# Patient Record
Sex: Female | Born: 1945 | Race: Black or African American | Hispanic: No | Marital: Married | Smoking: Never smoker
Health system: Southern US, Community
[De-identification: ages and names within clinical notes are randomized; demographics above are authoritative.]

## PROBLEM LIST (undated history)

## (undated) DIAGNOSIS — I10 Essential (primary) hypertension: Secondary | ICD-10-CM

## (undated) DIAGNOSIS — R7303 Prediabetes: Secondary | ICD-10-CM

## (undated) HISTORY — PX: ABDOMINAL HYSTERECTOMY: SHX81

## (undated) HISTORY — PX: NECK SURGERY: SHX720

---

## 2019-03-13 ENCOUNTER — Other Ambulatory Visit: Payer: Self-pay

## 2019-03-13 ENCOUNTER — Inpatient Hospital Stay (HOSPITAL_COMMUNITY)
Admission: EM | Admit: 2019-03-13 | Discharge: 2019-03-15 | DRG: 040 | Disposition: A | Discharge status: Home Health | Payer: Medicare Other | Attending: Internal Medicine | Admitting: Internal Medicine

## 2019-03-13 DIAGNOSIS — M5432 Sciatica, left side: Secondary | ICD-10-CM | POA: Diagnosis present

## 2019-03-13 DIAGNOSIS — I6340 Cerebral infarction due to embolism of unspecified cerebral artery: Principal | ICD-10-CM | POA: Diagnosis present

## 2019-03-13 DIAGNOSIS — E785 Hyperlipidemia, unspecified: Secondary | ICD-10-CM | POA: Diagnosis present

## 2019-03-13 DIAGNOSIS — J3801 Paralysis of vocal cords and larynx, unilateral: Secondary | ICD-10-CM | POA: Diagnosis present

## 2019-03-13 DIAGNOSIS — I253 Aneurysm of heart: Secondary | ICD-10-CM | POA: Diagnosis present

## 2019-03-13 DIAGNOSIS — Z79899 Other long term (current) drug therapy: Secondary | ICD-10-CM

## 2019-03-13 DIAGNOSIS — Z20828 Contact with and (suspected) exposure to other viral communicable diseases: Secondary | ICD-10-CM | POA: Diagnosis present

## 2019-03-13 DIAGNOSIS — E1151 Type 2 diabetes mellitus with diabetic peripheral angiopathy without gangrene: Secondary | ICD-10-CM | POA: Diagnosis present

## 2019-03-13 DIAGNOSIS — R29898 Other symptoms and signs involving the musculoskeletal system: Secondary | ICD-10-CM

## 2019-03-13 DIAGNOSIS — E1122 Type 2 diabetes mellitus with diabetic chronic kidney disease: Secondary | ICD-10-CM | POA: Diagnosis present

## 2019-03-13 DIAGNOSIS — Q2112 Patent foramen ovale: Secondary | ICD-10-CM

## 2019-03-13 DIAGNOSIS — I10 Essential (primary) hypertension: Secondary | ICD-10-CM | POA: Diagnosis present

## 2019-03-13 DIAGNOSIS — Z8249 Family history of ischemic heart disease and other diseases of the circulatory system: Secondary | ICD-10-CM

## 2019-03-13 DIAGNOSIS — I639 Cerebral infarction, unspecified: Secondary | ICD-10-CM | POA: Diagnosis not present

## 2019-03-13 DIAGNOSIS — G9389 Other specified disorders of brain: Secondary | ICD-10-CM | POA: Diagnosis present

## 2019-03-13 DIAGNOSIS — N183 Chronic kidney disease, stage 3 unspecified: Secondary | ICD-10-CM | POA: Diagnosis present

## 2019-03-13 DIAGNOSIS — G459 Transient cerebral ischemic attack, unspecified: Secondary | ICD-10-CM | POA: Diagnosis present

## 2019-03-13 DIAGNOSIS — G935 Compression of brain: Secondary | ICD-10-CM | POA: Diagnosis present

## 2019-03-13 DIAGNOSIS — Z833 Family history of diabetes mellitus: Secondary | ICD-10-CM

## 2019-03-13 DIAGNOSIS — M50221 Other cervical disc displacement at C4-C5 level: Secondary | ICD-10-CM | POA: Diagnosis present

## 2019-03-13 DIAGNOSIS — R29700 NIHSS score 0: Secondary | ICD-10-CM | POA: Diagnosis present

## 2019-03-13 DIAGNOSIS — I129 Hypertensive chronic kidney disease with stage 1 through stage 4 chronic kidney disease, or unspecified chronic kidney disease: Secondary | ICD-10-CM | POA: Diagnosis present

## 2019-03-13 DIAGNOSIS — Z7952 Long term (current) use of systemic steroids: Secondary | ICD-10-CM

## 2019-03-13 DIAGNOSIS — Q211 Atrial septal defect: Secondary | ICD-10-CM

## 2019-03-13 DIAGNOSIS — G950 Syringomyelia and syringobulbia: Secondary | ICD-10-CM | POA: Diagnosis present

## 2019-03-13 DIAGNOSIS — Z7984 Long term (current) use of oral hypoglycemic drugs: Secondary | ICD-10-CM

## 2019-03-13 DIAGNOSIS — Z6841 Body Mass Index (BMI) 40.0 and over, adult: Secondary | ICD-10-CM

## 2019-03-13 HISTORY — DX: Essential (primary) hypertension: I10

## 2019-03-13 HISTORY — DX: Prediabetes: R73.03

## 2019-03-13 LAB — COMPREHENSIVE METABOLIC PANEL
ALT: 21 U/L (ref 0–44)
AST: 22 U/L (ref 15–41)
Albumin: 3.6 g/dL (ref 3.5–5.0)
Alkaline Phosphatase: 40 U/L (ref 38–126)
Anion gap: 12 (ref 5–15)
BUN: 22 mg/dL (ref 8–23)
CO2: 26 mmol/L (ref 22–32)
Calcium: 9.2 mg/dL (ref 8.9–10.3)
Chloride: 100 mmol/L (ref 98–111)
Creatinine, Ser: 1.24 mg/dL — ABNORMAL HIGH (ref 0.44–1.00)
GFR calc Af Amer: 51 mL/min — ABNORMAL LOW (ref >60)
GFR calc non Af Amer: 44 mL/min — ABNORMAL LOW (ref >60)
Glucose, Bld: 133 mg/dL — ABNORMAL HIGH (ref 70–99)
Potassium: 4 mmol/L (ref 3.5–5.1)
Sodium: 138 mmol/L (ref 135–145)
Total Bilirubin: 0.6 mg/dL (ref 0.3–1.2)
Total Protein: 6.8 g/dL (ref 6.5–8.1)

## 2019-03-13 LAB — I-STAT CHEM 8, ED
BUN: 25 mg/dL — ABNORMAL HIGH (ref 8–23)
Calcium, Ion: 1.17 mmol/L (ref 1.15–1.40)
Chloride: 102 mmol/L (ref 98–111)
Creatinine, Ser: 1.1 mg/dL — ABNORMAL HIGH (ref 0.44–1.00)
Glucose, Bld: 127 mg/dL — ABNORMAL HIGH (ref 70–99)
HCT: 45 % (ref 36.0–46.0)
Hemoglobin: 15.3 g/dL — ABNORMAL HIGH (ref 12.0–15.0)
Potassium: 3.9 mmol/L (ref 3.5–5.1)
Sodium: 140 mmol/L (ref 135–145)
TCO2: 27 mmol/L (ref 22–32)

## 2019-03-13 LAB — DIFFERENTIAL
Abs Immature Granulocytes: 0.03 10*3/uL (ref 0.00–0.07)
Basophils Absolute: 0 10*3/uL (ref 0.0–0.1)
Basophils Relative: 0 %
Eosinophils Absolute: 0.1 10*3/uL (ref 0.0–0.5)
Eosinophils Relative: 1 %
Immature Granulocytes: 0 %
Lymphocytes Relative: 41 %
Lymphs Abs: 4.2 10*3/uL — ABNORMAL HIGH (ref 0.7–4.0)
Monocytes Absolute: 0.8 10*3/uL (ref 0.1–1.0)
Monocytes Relative: 8 %
Neutro Abs: 5 10*3/uL (ref 1.7–7.7)
Neutrophils Relative %: 50 %

## 2019-03-13 LAB — PROTIME-INR
INR: 1 (ref 0.8–1.2)
Prothrombin Time: 13 seconds (ref 11.4–15.2)

## 2019-03-13 LAB — CBC
HCT: 44.1 % (ref 36.0–46.0)
Hemoglobin: 14.4 g/dL (ref 12.0–15.0)
MCH: 29.5 pg (ref 26.0–34.0)
MCHC: 32.7 g/dL (ref 30.0–36.0)
MCV: 90.4 fL (ref 80.0–100.0)
Platelets: 264 10*3/uL (ref 150–400)
RBC: 4.88 MIL/uL (ref 3.87–5.11)
RDW: 14.7 % (ref 11.5–15.5)
WBC: 10.2 10*3/uL (ref 4.0–10.5)
nRBC: 0 % (ref 0.0–0.2)

## 2019-03-13 LAB — APTT: aPTT: 26 seconds (ref 24–36)

## 2019-03-13 NOTE — ED Triage Notes (Signed)
Pt here for numbness and weakness in her R hand this morning at 0930. Pt Denies pain. Pts sts symptoms have improved in triage. Endorses having fasted for the last ten days from meat and sweets and carbs.

## 2019-03-13 NOTE — ED Triage Notes (Signed)
Pt taken immediately upon registration to bridge to see Dr. Maryan Rued to r/o code stroke. -- cleared by Dr. Maryan Rued, returned to triage to be checked in,

## 2019-03-14 ENCOUNTER — Inpatient Hospital Stay (HOSPITAL_COMMUNITY): Payer: Medicare Other

## 2019-03-14 ENCOUNTER — Observation Stay (HOSPITAL_COMMUNITY): Payer: Medicare Other

## 2019-03-14 ENCOUNTER — Encounter (HOSPITAL_COMMUNITY): Payer: Self-pay | Admitting: Internal Medicine

## 2019-03-14 ENCOUNTER — Emergency Department (HOSPITAL_COMMUNITY): Payer: Medicare Other

## 2019-03-14 DIAGNOSIS — Z833 Family history of diabetes mellitus: Secondary | ICD-10-CM | POA: Diagnosis not present

## 2019-03-14 DIAGNOSIS — M5432 Sciatica, left side: Secondary | ICD-10-CM | POA: Diagnosis present

## 2019-03-14 DIAGNOSIS — Z7984 Long term (current) use of oral hypoglycemic drugs: Secondary | ICD-10-CM | POA: Diagnosis not present

## 2019-03-14 DIAGNOSIS — E1151 Type 2 diabetes mellitus with diabetic peripheral angiopathy without gangrene: Secondary | ICD-10-CM | POA: Diagnosis present

## 2019-03-14 DIAGNOSIS — I10 Essential (primary) hypertension: Secondary | ICD-10-CM | POA: Diagnosis not present

## 2019-03-14 DIAGNOSIS — Z6841 Body Mass Index (BMI) 40.0 and over, adult: Secondary | ICD-10-CM | POA: Diagnosis not present

## 2019-03-14 DIAGNOSIS — I6340 Cerebral infarction due to embolism of unspecified cerebral artery: Secondary | ICD-10-CM | POA: Diagnosis present

## 2019-03-14 DIAGNOSIS — G950 Syringomyelia and syringobulbia: Secondary | ICD-10-CM | POA: Diagnosis present

## 2019-03-14 DIAGNOSIS — Z79899 Other long term (current) drug therapy: Secondary | ICD-10-CM | POA: Diagnosis not present

## 2019-03-14 DIAGNOSIS — I639 Cerebral infarction, unspecified: Secondary | ICD-10-CM | POA: Diagnosis present

## 2019-03-14 DIAGNOSIS — I340 Nonrheumatic mitral (valve) insufficiency: Secondary | ICD-10-CM | POA: Diagnosis not present

## 2019-03-14 DIAGNOSIS — E785 Hyperlipidemia, unspecified: Secondary | ICD-10-CM | POA: Diagnosis present

## 2019-03-14 DIAGNOSIS — Z8249 Family history of ischemic heart disease and other diseases of the circulatory system: Secondary | ICD-10-CM | POA: Diagnosis not present

## 2019-03-14 DIAGNOSIS — Z7952 Long term (current) use of systemic steroids: Secondary | ICD-10-CM | POA: Diagnosis not present

## 2019-03-14 DIAGNOSIS — G459 Transient cerebral ischemic attack, unspecified: Secondary | ICD-10-CM

## 2019-03-14 DIAGNOSIS — I129 Hypertensive chronic kidney disease with stage 1 through stage 4 chronic kidney disease, or unspecified chronic kidney disease: Secondary | ICD-10-CM | POA: Diagnosis present

## 2019-03-14 DIAGNOSIS — J3801 Paralysis of vocal cords and larynx, unilateral: Secondary | ICD-10-CM | POA: Diagnosis present

## 2019-03-14 DIAGNOSIS — R29700 NIHSS score 0: Secondary | ICD-10-CM | POA: Diagnosis present

## 2019-03-14 DIAGNOSIS — Z20828 Contact with and (suspected) exposure to other viral communicable diseases: Secondary | ICD-10-CM | POA: Diagnosis present

## 2019-03-14 DIAGNOSIS — I361 Nonrheumatic tricuspid (valve) insufficiency: Secondary | ICD-10-CM | POA: Diagnosis not present

## 2019-03-14 DIAGNOSIS — E1122 Type 2 diabetes mellitus with diabetic chronic kidney disease: Secondary | ICD-10-CM | POA: Diagnosis present

## 2019-03-14 DIAGNOSIS — N183 Chronic kidney disease, stage 3 unspecified: Secondary | ICD-10-CM | POA: Diagnosis present

## 2019-03-14 DIAGNOSIS — G9389 Other specified disorders of brain: Secondary | ICD-10-CM | POA: Diagnosis present

## 2019-03-14 DIAGNOSIS — I253 Aneurysm of heart: Secondary | ICD-10-CM | POA: Diagnosis present

## 2019-03-14 DIAGNOSIS — M50221 Other cervical disc displacement at C4-C5 level: Secondary | ICD-10-CM | POA: Diagnosis present

## 2019-03-14 DIAGNOSIS — Q211 Atrial septal defect: Secondary | ICD-10-CM | POA: Diagnosis not present

## 2019-03-14 DIAGNOSIS — G935 Compression of brain: Secondary | ICD-10-CM | POA: Diagnosis present

## 2019-03-14 DIAGNOSIS — I6389 Other cerebral infarction: Secondary | ICD-10-CM | POA: Diagnosis not present

## 2019-03-14 LAB — COMPREHENSIVE METABOLIC PANEL
ALT: 19 U/L (ref 0–44)
AST: 17 U/L (ref 15–41)
Albumin: 3.6 g/dL (ref 3.5–5.0)
Alkaline Phosphatase: 34 U/L — ABNORMAL LOW (ref 38–126)
Anion gap: 11 (ref 5–15)
BUN: 20 mg/dL (ref 8–23)
CO2: 28 mmol/L (ref 22–32)
Calcium: 9.1 mg/dL (ref 8.9–10.3)
Chloride: 101 mmol/L (ref 98–111)
Creatinine, Ser: 1.09 mg/dL — ABNORMAL HIGH (ref 0.44–1.00)
GFR calc Af Amer: 60 mL/min — ABNORMAL LOW (ref >60)
GFR calc non Af Amer: 51 mL/min — ABNORMAL LOW (ref >60)
Glucose, Bld: 111 mg/dL — ABNORMAL HIGH (ref 70–99)
Potassium: 4 mmol/L (ref 3.5–5.1)
Sodium: 140 mmol/L (ref 135–145)
Total Bilirubin: 0.8 mg/dL (ref 0.3–1.2)
Total Protein: 6.5 g/dL (ref 6.5–8.1)

## 2019-03-14 LAB — GLUCOSE, CAPILLARY
Glucose-Capillary: 127 mg/dL — ABNORMAL HIGH (ref 70–99)
Glucose-Capillary: 117 mg/dL — ABNORMAL HIGH (ref 70–99)
Glucose-Capillary: 101 mg/dL — ABNORMAL HIGH (ref 70–99)
Glucose-Capillary: 111 mg/dL — ABNORMAL HIGH (ref 70–99)

## 2019-03-14 LAB — LIPID PANEL
Cholesterol: 173 mg/dL (ref 0–200)
HDL: 45 mg/dL (ref >40)
LDL Cholesterol: 109 mg/dL — ABNORMAL HIGH (ref 0–99)
Total CHOL/HDL Ratio: 3.8 RATIO
Triglycerides: 96 mg/dL (ref <150)
VLDL: 19 mg/dL (ref 0–40)

## 2019-03-14 LAB — HIV ANTIBODY (ROUTINE TESTING W REFLEX): HIV Screen 4th Generation wRfx: NONREACTIVE

## 2019-03-14 LAB — CBC
HCT: 43.4 % (ref 36.0–46.0)
Hemoglobin: 14.2 g/dL (ref 12.0–15.0)
MCH: 29.2 pg (ref 26.0–34.0)
MCHC: 32.7 g/dL (ref 30.0–36.0)
MCV: 89.3 fL (ref 80.0–100.0)
Platelets: 258 10*3/uL (ref 150–400)
RBC: 4.86 MIL/uL (ref 3.87–5.11)
RDW: 14.8 % (ref 11.5–15.5)
WBC: 7.4 10*3/uL (ref 4.0–10.5)
nRBC: 0 % (ref 0.0–0.2)

## 2019-03-14 LAB — ECHOCARDIOGRAM COMPLETE
Height: 65 in
Weight: 4080 oz

## 2019-03-14 LAB — CBG MONITORING, ED: Glucose-Capillary: 101 mg/dL — ABNORMAL HIGH (ref 70–99)

## 2019-03-14 LAB — HEMOGLOBIN A1C
Hgb A1c MFr Bld: 6.4 % — ABNORMAL HIGH (ref 4.8–5.6)
Mean Plasma Glucose: 136.98 mg/dL

## 2019-03-14 LAB — SARS CORONAVIRUS 2 (TAT 6-24 HRS): SARS Coronavirus 2: NEGATIVE

## 2019-03-14 MED ORDER — ACETAMINOPHEN 650 MG RE SUPP: 650.0000 mg | RECTAL | Status: DC | PRN

## 2019-03-14 MED ORDER — LISINOPRIL 20 MG PO TABS
20.0000 mg | ORAL_TABLET | Freq: Every day | ORAL | Status: DC
  Administered 2019-03-14 – 2019-03-15 (×2): 20 mg via ORAL
  Filled 2019-03-14 (×2): qty 1

## 2019-03-14 MED ORDER — STROKE: EARLY STAGES OF RECOVERY BOOK
Freq: Once | Status: DC
  Filled 2019-03-14: qty 1

## 2019-03-14 MED ORDER — PRAVASTATIN SODIUM 40 MG PO TABS
40.0000 mg | ORAL_TABLET | Freq: Every day | ORAL | Status: DC
  Administered 2019-03-14 – 2019-03-15 (×2): 40 mg via ORAL
  Filled 2019-03-14 (×2): qty 1

## 2019-03-14 MED ORDER — ASPIRIN 325 MG PO TABS
325.0000 mg | ORAL_TABLET | Freq: Every day | ORAL | Status: DC
  Administered 2019-03-14 – 2019-03-15 (×2): 325 mg via ORAL
  Filled 2019-03-14 (×2): qty 1

## 2019-03-14 MED ORDER — ASPIRIN 300 MG RE SUPP: 300.0000 mg | Freq: Every day | RECTAL | Status: DC

## 2019-03-14 MED ORDER — INSULIN ASPART 100 UNIT/ML ~~LOC~~ SOLN
0.0000 [IU] | Freq: Three times a day (TID) | SUBCUTANEOUS | Status: DC
  Administered 2019-03-14: 1 [IU] via SUBCUTANEOUS

## 2019-03-14 MED ORDER — ACETAMINOPHEN 160 MG/5ML PO SOLN: 650.0000 mg | ORAL | Status: DC | PRN

## 2019-03-14 MED ORDER — SODIUM CHLORIDE 0.9 % IV SOLN: INTRAVENOUS | Status: AC

## 2019-03-14 MED ORDER — IOHEXOL 350 MG/ML SOLN
50.0000 mL | Freq: Once | INTRAVENOUS | Status: AC | PRN
  Administered 2019-03-14: 50 mL via INTRAVENOUS

## 2019-03-14 MED ORDER — VITAMIN D 25 MCG (1000 UNIT) PO TABS
2000.0000 [IU] | ORAL_TABLET | Freq: Every day | ORAL | Status: DC
  Administered 2019-03-14 – 2019-03-15 (×2): 2000 [IU] via ORAL
  Filled 2019-03-14 (×2): qty 2

## 2019-03-14 MED ORDER — ACETAMINOPHEN 325 MG PO TABS
650.0000 mg | ORAL_TABLET | ORAL | Status: DC | PRN
  Administered 2019-03-14 – 2019-03-15 (×3): 650 mg via ORAL
  Filled 2019-03-14 (×3): qty 2

## 2019-03-14 MED ORDER — ENOXAPARIN SODIUM 40 MG/0.4ML ~~LOC~~ SOLN
40.0000 mg | SUBCUTANEOUS | Status: DC
  Administered 2019-03-15: 40 mg via SUBCUTANEOUS
  Filled 2019-03-14: qty 0.4

## 2019-03-14 NOTE — ED Notes (Signed)
Admiting at bedside will return to obtain IV and MRI

## 2019-03-14 NOTE — ED Notes (Signed)
ED TO INPATIENT HANDOFF REPORT  ED Nurse Name and Phone #:  [phone]  S Name/Age/Gender Martha Kelly 73 y.o. female Room/Bed: 025C/025C  Code Status   Code Status: Not on file  Home/SNF/Other Home Patient oriented to: self, place, time and situation Is this baseline? Yes   Triage Complete: Triage complete  Chief Complaint R Arm Weakness/Numbness  Triage Note Pt taken immediately upon registration to bridge to see Dr. Maryan Rued to r/o code stroke. -- cleared by Dr. Maryan Rued, returned to triage to be checked in,   Pt here for numbness and weakness in her R hand this morning at 0930. Pt Denies pain. Pts sts symptoms have improved in triage. Endorses having fasted for the last ten days from meat and sweets and carbs.    Allergies No Known Allergies  Level of Care/Admitting Diagnosis ED Disposition    ED Disposition Condition Comment   Admit  Hospital Area: [place] [[id number]]  Level of Care: Telemetry Medical [104]  I expect the patient will be discharged within 24 hours: No (not a candidate for 5C-Observation unit)  Covid Evaluation: Asymptomatic Screening Protocol (No Symptoms)  Diagnosis: TIA (transient ischemic attack) [376283]  Admitting Physician: Rise Patience [phone]  Attending Physician: Rise Patience [[id number]]  PT Class (Do Not Modify): Observation [104]  PT Acc Code (Do Not Modify): Observation [10022]       B Medical/Surgery History Past Medical History:  Diagnosis Date  . Hypertension   . Prediabetes       A IV Location/Drains/Wounds Patient Lines/Drains/Airways Status   Active Line/Drains/Airways    Name:   Placement date:   Placement time:   Site:   Days:   Peripheral IV 03/14/19 Right Forearm   03/14/19    0508    Forearm   less than 1          Intake/Output Last 24 hours No intake or output data in the 24 hours ending 03/14/19 0516  Labs/Imaging Results for orders placed or performed during the  hospital encounter of 03/13/19 (from the past 48 hour(s))  Protime-INR     Status: None   Collection Time: 03/13/19  7:04 PM  Result Value Ref Range   Prothrombin Time 13.0 11.4 - 15.2 seconds   INR 1.0 0.8 - 1.2    Comment: (NOTE) INR goal varies based on device and disease states. Performed at [facility], [place] [address] [place], [place] [address]   APTT     Status: None   Collection Time: 03/13/19  7:04 PM  Result Value Ref Range   aPTT 26 24 - 36 seconds    Comment: Performed at [facility] [address] [place], [place] [address]  CBC     Status: None   Collection Time: 03/13/19  7:04 PM  Result Value Ref Range   WBC 10.2 4.0 - 10.5 K/uL   RBC 4.88 3.87 - 5.11 MIL/uL   Hemoglobin 14.4 12.0 - 15.0 g/dL   HCT 44.1 36.0 - 46.0 %   MCV 90.4 80.0 - 100.0 fL   MCH 29.5 26.0 - 34.0 pg   MCHC 32.7 30.0 - 36.0 g/dL   RDW 14.7 11.5 - 15.5 %   Platelets 264 150 - 400 K/uL   nRBC 0.0 0.0 - 0.2 %    Comment: Performed at [facility], [place] [address], [place], [place] [address]  Differential     Status: Abnormal   Collection Time: 03/13/19  7:04 PM  Result Value Ref Range   Neutrophils Relative % 50 %   Neutro Abs 5.0 1.7 - 7.7 K/uL   Lymphocytes Relative 41 %   Lymphs Abs 4.2 (H) 0.7 - 4.0 K/uL   Monocytes Relative 8 %   Monocytes Absolute 0.8 0.1 - 1.0 K/uL   Eosinophils Relative 1 %   Eosinophils Absolute 0.1 0.0 - 0.5 K/uL   Basophils Relative 0 %   Basophils Absolute 0.0 0.0 - 0.1 K/uL   Immature Granulocytes 0 %   Abs Immature Granulocytes 0.03 0.00 - 0.07 K/uL    Comment: Performed at [facility], [address] [place], [place] [address]  Comprehensive metabolic panel     Status: Abnormal   Collection Time: 03/13/19  7:04 PM  Result Value Ref Range   Sodium 138 135 - 145 mmol/L   Potassium 4.0 3.5 - 5.1 mmol/L   Chloride 100 98 - 111 mmol/L   CO2 26 22 - 32 mmol/L   Glucose, Bld 133 (H) 70 - 99 mg/dL   BUN 22 8 - 23 mg/dL    Creatinine, Ser 4.40 (H) 0.44 - 1.00 mg/dL   Calcium 9.2 8.9 - 34.7 mg/dL   Total Protein 6.8 6.5 - 8.1 g/dL   Albumin 3.6 3.5 - 5.0 g/dL   AST 22 15 - 41 U/L   ALT 21 0 - 44 U/L   Alkaline Phosphatase 40 38 - 126 U/L   Total Bilirubin 0.6 0.3 - 1.2 mg/dL   GFR calc non Af Amer 44 (L) >60 mL/min   GFR calc Af Amer 51 (L) >60 mL/min   Anion gap 12 5 - 15    Comment: Performed at [facility], [address] [place], [place] [address]  I-stat chem 8, ED     Status: Abnormal   Collection Time: 03/13/19  7:16 PM  Result Value Ref Range   Sodium 140 135 - 145 mmol/L   Potassium 3.9 3.5 - 5.1 mmol/L   Chloride 102 98 - 111 mmol/L   BUN 25 (H) 8 - 23 mg/dL   Creatinine, Ser 6.38 (H) 0.44 - 1.00 mg/dL   Glucose, Bld 756 (H) 70 - 99 mg/dL   Calcium, Ion 4.33 2.95 - 1.40 mmol/L   TCO2 27 22 - 32 mmol/L   Hemoglobin 15.3 (H) 12.0 - 15.0 g/dL   HCT 18.8 41.6 - 60.6 %  CBG monitoring, ED     Status: Abnormal   Collection Time: 03/14/19  2:41 AM  Result Value Ref Range   Glucose-Capillary 101 (H) 70 - 99 mg/dL   Ct Head Wo Contrast  Result Date: 03/14/2019 CLINICAL DATA:  Transient right upper extremity weakness EXAM: CT HEAD WITHOUT CONTRAST TECHNIQUE: Contiguous axial images were obtained from the base of the skull through the vertex without intravenous contrast. COMPARISON:  None. FINDINGS: Brain: There are postsurgical changes from prior left suboccipital craniectomy with gliosis in the left cerebellar hemisphere and expected dural thickening of the posterior fossa. Patchy areas of white matter hypoattenuation are most compatible with chronic microvascular angiopathy. No evidence of acute infarction, hemorrhage, frank hydrocephalus or extra-axial collection. Vascular: Atherosclerotic calcification of the carotid siphons. No hyperdense vessel. Skull: Prior suboccipital craniectomy and partial resection of the posterior arch of C1. No acute osseous abnormality. No scalp swelling or  hematoma. Few benign scalp calcifications are present. Sinuses/Orbits: Paranasal sinuses and mastoid air cells are predominantly clear. Included orbital structures are unremarkable. Other: None IMPRESSION:  1. No CT evidence of large territory infarct or other acute intracranial abnormality. If there is persisting clinical concern for ischemia, consider MRI evaluation. 2. Postsurgical changes from prior left suboccipital craniectomy and partial resection of the posterior arch of C1. Gliosis of the left cerebellar hemisphere could reflect postsurgical changes from prior resection or infarct. Correlate with patient history. Electronically Signed   By: Kreg ShropshirePrice  DeHay M.D.   On: 03/14/2019 03:42    Pending Labs Unresulted Labs (From admission, onward)    Start     Ordered   03/14/19 0407  SARS CORONAVIRUS 2 (TAT 6-24 HRS) Nasopharyngeal Nasopharyngeal Swab  (Asymptomatic/Tier 2 Patients Labs)  Once,   STAT    Question Answer Comment  Is this test for diagnosis or screening Screening   Symptomatic for COVID-19 as defined by CDC No   Hospitalized for COVID-19 No   Admitted to ICU for COVID-19 No   Previously tested for COVID-19 No   Resident in a congregate (group) care setting No   Employed in healthcare setting No   Pregnant No      03/14/19 0406          Vitals/Pain Today's Vitals   03/14/19 0250 03/14/19 0300 03/14/19 0315 03/14/19 0445  BP:  131/87 127/63   Pulse:  82 79 86  Resp:  17 15 17   Temp:      TempSrc:      SpO2:  97% 97% 94%  Weight: 115.7 kg     Height: 5\' 5"  (1.651 m)     PainSc:        Isolation Precautions No active isolations  Medications Medications - No data to display  Mobility walks Low fall risk   Focused Assessments Neuro Assessment Handoff:  Swallow screen pass? Yes  Cardiac Rhythm: Normal sinus rhythm       Neuro Assessment: Within Defined Limits Neuro Checks:      Last Documented NIHSS Modified Score:   Has TPA been given? No If patient  is a Neuro Trauma and patient is going to OR before floor call report to 4N Charge nurse: [phone] or [phone]     R Recommendations: See Admitting Provider Note  Report given to:   Additional Notes:  RN to call back [phone]

## 2019-03-14 NOTE — H&P (Signed)
History and Physical    Martha Kelly MPN:[id number] DOB: January 25, 1946 DOA: 03/13/2019  PCP: Martha Kelly  Patient coming from: Home.  Chief Complaint: Right upper extremity numbness and weakness.  HPI: Martha Kelly is a 73 y.o. 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  Patient was not able to hold his utensils well when she was trying to eat.  Had no difficulty swallowing speaking or did not lose functions of the other extremity.  Symptoms lasted till 4 PM yesterday after he started around 9 AM.  Symptoms resolved after patient ate something.  Given the symptoms patient was brought to the ER concerning for stroke.  Patient usually lives in [place] and is visiting [place] to help with her daughter who had given birth recently.  ED Course: In the ER patient was nonfocal.  CT head shows chronic changes with nothing acute.  EKG shows normal sinus rhythm.  On-call neurologist was consulted patient admitted for TIA work-up.  On exam patient has good strength in all extremities.  No facial asymmetry pupils equal and reacting to light.  Labs show creatinine of 1.2 hemoglobin 14.4 platelets 264 COVID-19 test is pending.  Review of Systems: As per HPI, rest all negative.   Past Medical History:  Diagnosis Date  . Hypertension   . Prediabetes     Past Surgical History:  Procedure Laterality Date  . ABDOMINAL HYSTERECTOMY    . NECK SURGERY       reports that she has never smoked. She has never used smokeless tobacco. No history on file for alcohol and drug.  No Known Allergies  Family History  Problem  Relation Age of Onset  . CAD Mother   . CAD Father   . Diabetes Mellitus II Maternal Uncle     Prior to Admission medications   Medication Sig Start Date End Date Taking? Authorizing Provider  Cholecalciferol (VITAMIN D) 50 MCG (2000 UT) tablet Take 2,000 Units by mouth daily.   Yes [provider]  hydrochlorothiazide (HYDRODIURIL) 25 MG tablet Take 25 mg by mouth daily. 01/14/19  Yes [provider]  lisinopril (ZESTRIL) 20 MG tablet Take 20 mg by mouth daily. 02/23/19  Yes [provider]  LIVALO 2 MG TABS Take 2 mg by mouth at bedtime. 01/23/19  Yes [provider]  metFORMIN (GLUCOPHAGE) 500 MG tablet Take 500 mg by mouth 2 (two) times daily. 03/01/19  Yes [provider]  predniSONE (DELTASONE) 20 MG tablet Take 10 mg by mouth daily. 03/07/19  Yes [provider]    Physical Exam: Constitutional: Moderately built and nourished. Vitals:   03/14/19 0250 03/14/19 0300 03/14/19 0315 03/14/19 0445  BP:  131/87 127/63   Pulse:  82 79 86  Resp:  17 15 17   Temp:      TempSrc:      SpO2:  97% 97% 94%  Weight: 115.7 kg     Height: 5\' 5"  (1.651 m)      Eyes: Anicteric no pallor. ENMT: No discharge from the ears eyes nose or mouth. Neck: No mass felt.  No neck rigidity. Respiratory: No rhonchi or crepitations. Cardiovascular: 1 S2 heard. Abdomen: Soft nontender bowel  sounds present. Musculoskeletal: No edema. Skin: No rash. Neurologic: Alert awake oriented to time place and person.  Moves all extremities 5 x 5.  No facial asymmetry tongue is midline pupils equal and reacting to light. Psychiatric: Appears normal.   Labs on Admission: I have personally reviewed following labs and imaging studies  CBC: Recent Labs  Lab 03/13/19 1904 03/13/19 1916  WBC 10.2  --   NEUTROABS 5.0  --   HGB 14.4 15.3*  HCT 44.1 45.0  MCV 90.4  --   PLT 264  --    Basic Metabolic Panel: Recent Labs  Lab 03/13/19 1904 03/13/19 1916  NA 138  140  K 4.0 3.9  CL 100 102  CO2 26  --   GLUCOSE 133* 127*  BUN 22 25*  CREATININE 1.24* 1.10*  CALCIUM 9.2  --    GFR: Estimated Creatinine Clearance: 60.5 mL/min (A) (by C-G formula based on SCr of 1.1 mg/dL (H)). Liver Function Tests: Recent Labs  Lab 03/13/19 1904  AST 22  ALT 21  ALKPHOS 40  BILITOT 0.6  PROT 6.8  ALBUMIN 3.6   No results for input(s): LIPASE, AMYLASE in the last 168 hours. No results for input(s): AMMONIA in the last 168 hours. Coagulation Profile: Recent Labs  Lab 03/13/19 1904  INR 1.0   Cardiac Enzymes: No results for input(s): CKTOTAL, CKMB, CKMBINDEX, TROPONINI in the last 168 hours. BNP (last 3 results) No results for input(s): PROBNP in the last 8760 hours. HbA1C: No results for input(s): HGBA1C in the last 72 hours. CBG: Recent Labs  Lab 03/14/19 0241  GLUCAP 101*   Lipid Profile: No results for input(s): CHOL, HDL, LDLCALC, TRIG, CHOLHDL, LDLDIRECT in the last 72 hours. Thyroid Function Tests: No results for input(s): TSH, T4TOTAL, FREET4, T3FREE, THYROIDAB in the last 72 hours. Anemia Panel: No results for input(s): VITAMINB12, FOLATE, FERRITIN, TIBC, IRON, RETICCTPCT in the last 72 hours. Urine analysis: No results found for: COLORURINE, APPEARANCEUR, LABSPEC, PHURINE, GLUCOSEU, HGBUR, BILIRUBINUR, KETONESUR, PROTEINUR, UROBILINOGEN, NITRITE, LEUKOCYTESUR Sepsis Labs: @LABRCNTIP (procalcitonin:4,lacticidven:4) )No results found for this or any previous visit (from the past 240 hour(s)).   Radiological Exams on Admission: Ct Head Wo Contrast  Result Date: 03/14/2019 CLINICAL DATA:  Transient right upper extremity weakness EXAM: CT HEAD WITHOUT CONTRAST TECHNIQUE: Contiguous axial images were obtained from the base of the skull through the vertex without intravenous contrast. COMPARISON:  None. FINDINGS: Brain: There are postsurgical changes from prior left suboccipital craniectomy with gliosis in the left cerebellar hemisphere  and expected dural thickening of the posterior fossa. Patchy areas of white matter hypoattenuation are most compatible with chronic microvascular angiopathy. No evidence of acute infarction, hemorrhage, frank hydrocephalus or extra-axial collection. Vascular: Atherosclerotic calcification of the carotid siphons. No hyperdense vessel. Skull: Prior suboccipital craniectomy and partial resection of the posterior arch of C1. No acute osseous abnormality. No scalp swelling or hematoma. Few benign scalp calcifications are present. Sinuses/Orbits: Paranasal sinuses and mastoid air cells are predominantly clear. Included orbital structures are unremarkable. Other: None IMPRESSION: 1. No CT evidence of large territory infarct or other acute intracranial abnormality. If there is persisting clinical concern for ischemia, consider MRI evaluation. 2. Postsurgical changes from prior left suboccipital craniectomy and partial resection of the posterior arch of C1. Gliosis of the left cerebellar hemisphere could reflect postsurgical changes from prior resection or infarct. Correlate with patient history. Electronically Signed   By: Lovena Le M.D.   On: 03/14/2019 03:42    EKG: Independently  reviewed.  Normal sinus rhythm.  Assessment/Plan Principal Problem:   TIA (transient ischemic attack) Active Problems:   Essential hypertension   HLD (hyperlipidemia)    1. TIA -discussed with on-call neurologist Dr. Georgiana SpinnerSushanth Aroor.  Patient symptoms are concerning for TIA.  Patient has been placed on aspirin and is already on statins.  Will check MRI brain CT angiogram of the head and neck with contrast 2D echo physical therapy consult hemoglobin A1c lipid panel. 2. Hypertension on lisinopril.  Will hold hydrochlorothiazide since patient is getting hydration.  Allow for permissive hypertension. 3. Hyperlipidemia on statins. 4. Prediabetes we will check hemoglobin A1c. 5. Recently treated for left lower extremity sciatica.   Was on prednisone. 6. History of syringomyelia status post surgery in the 90s. 7. Chronic kidney disease stage III creatinine appears to be at baseline when compared to labs done on February 22, 2019 labs are available in care everywhere.  Note that patient is on lisinopril.   DVT prophylaxis: Lovenox. Code Status: Full code. Family Communication: Patient's daughter. Disposition Plan: Home. Consults called: Neurology. Admission status: Observation.   Eduard ClosArshad N Lajoy Vanamburg MD [facility] Pager [phone].  If 7PM-7AM, please contact night-coverage [email or web address] Password [email or web address]  03/14/2019, 6:24 AM

## 2019-03-14 NOTE — Progress Notes (Signed)
SLP Cancellation Note  Patient Details Name: Martha Kelly MRN: [id number] DOB: 06-25-1945   Cancelled treatment:       Reason Eval/Treat Not Completed: Patient at procedure or test/unavailable(Pt off unit for CT. SLP will follow up. )  Katty Fretwell I. Hardin Negus, [place], [place] Office number [phone] Pager [place] 03/14/2019, 9:05 AM

## 2019-03-14 NOTE — H&P (View-Only) (Signed)
Neurology Consultation  Reason for Consult: Transient right-sided weakness Referring Physician: Dr. Hal Hope  History is obtained from: Patient and chart  HPI: Martha Kelly is a 73 y.o. female with history of prediabetes and hypertension.  Patient states that she does not take aspirin.  yesterday morning at approximately 9:30 in the morning she noticed that her right hand felt tingly.  When she went to grab her fork to start eating breakfast she noticed that she did not have any sensation in her right hand, in addition, her hand was not working as well as it normally would.  She has been on a no meat, no carbs, and no sweets diet for about a month.  Her daughter thought possibly that this could be the reason why she is having these symptoms. For that reason, she went out and baught her a salad with some chicken.  At that point she felt the symptoms got slightly worse, thus she came to the emergency department.  By 4:00 in the afternoon she states that her symptoms had fully dissipated.  Patient states she is never had symptoms like this in the past.  She did not feel significantly weak in the right arm, and had no numbness or tingling in that right arm, no visual deficits, no swallowing difficulties, no left-sided symptoms, no diplopia.   ED course CT head   Work up that has been done: CT head and MRI of brain   LKW: 9:30 AM on 03/13/2019 tpa given?: no, symptoms resolved Premorbid modified Rankin scale (mRS): 0 NIH stroke score: 0   ROS: A 14 point ROS was performed and is negative except as noted in the HPI.   Past Medical History:  Diagnosis Date  . Hypertension   . Prediabetes     Family History  Problem Relation Age of Onset  . CAD Mother   . CAD Father   . Diabetes Mellitus II Maternal Uncle    Social History:   reports that she has never smoked. She has never used smokeless tobacco. No history on file for alcohol and drug.  Medications  Current  Facility-Administered Medications:  .   stroke: mapping our early stages of recovery book, , Does not apply, Once, Rise Patience, MD .  0.9 %  sodium chloride infusion, , Intravenous, Continuous, Rise Patience, MD .  acetaminophen (TYLENOL) tablet 650 mg, 650 mg, Oral, Q4H PRN **OR** acetaminophen (TYLENOL) solution 650 mg, 650 mg, Per Tube, Q4H PRN **OR** acetaminophen (TYLENOL) suppository 650 mg, 650 mg, Rectal, Q4H PRN, Rise Patience, MD .  aspirin suppository 300 mg, 300 mg, Rectal, Daily **OR** aspirin tablet 325 mg, 325 mg, Oral, Daily, Rise Patience, MD .  cholecalciferol (VITAMIN D3) tablet 2,000 Units, 2,000 Units, Oral, Daily, Rise Patience, MD .  enoxaparin (LOVENOX) injection 40 mg, 40 mg, Subcutaneous, Q24H, Kakrakandy, Arshad N, MD .  insulin aspart (novoLOG) injection 0-9 Units, 0-9 Units, Subcutaneous, TID WC, Rise Patience, MD .  lisinopril (ZESTRIL) tablet 20 mg, 20 mg, Oral, Daily, Rise Patience, MD .  pravastatin (PRAVACHOL) tablet 40 mg, 40 mg, Oral, q1800, Rise Patience, MD   Exam: Current vital signs: BP (!) 142/74 (BP Location: Left Arm)   Pulse 68   Temp 98.5 F (36.9 C) (Oral)   Resp 18   Ht 5\' 5"  (1.651 m)   Wt 115.7 kg   SpO2 98%   BMI 42.43 kg/m  Vital signs in last 24 hours: Temp:  [98.2  F (36.8 C)-98.9 F (37.2 C)] 98.5 F (36.9 C) (10/01 0811) Pulse Rate:  [68-92] 68 (10/01 0811) Resp:  [13-18] 18 (10/01 0811) BP: (116-142)/(63-91) 142/74 (10/01 0811) SpO2:  [94 %-100 %] 98 % (10/01 0811) Weight:  [115.7 kg] 115.7 kg (10/01 0250)  Physical Exam  Constitutional: Appears well-developed and well-nourished.  Psych: Affect appropriate to situation Eyes: No scleral injection HENT: No OP obstrucion Head: Normocephalic.  Cardiovascular: Normal rate and regular rhythm.  Respiratory: Effort normal, non-labored breathing GI: Soft.  No distension. There is no tenderness.  Skin:  WDI  Neuro: Mental Status: Patient is awake, alert, oriented to person, place, month, year, and situation. Patient is able to give a clear and coherent history. No signs of aphasia or neglect Cranial Nerves: II: Visual Fields are full.  III,IV, VI: EOMI without ptosis or diploplia. Pupils equal, round and reactive to light V: Facial sensation is symmetric to temperature VII: Facial movement is symmetric.  VIII: hearing is intact to voice X: Palat elevates symmetrically XI: Shoulder shrug is symmetric. XII: tongue is midline without atrophy or fasciculations.  Motor: Tone is normal. Bulk is normal. 5/5 strength was present in all four extremities.  Patient does orbit her left hand around her right hand.  No drift is noted bilaterally Sensory: Sensation is symmetric to light touch and temperature in the arms and legs. Deep Tendon Reflexes: 2+ and symmetric in the biceps and patellae.  Plantars: Toes are downgoing bilaterally.  Cerebellar: FNF and HKS are intact bilaterally  Labs I have reviewed labs in epic and the results pertinent to this consultation are:   CBC    Component Value Date/Time   WBC 7.4 03/14/2019 0749   RBC 4.86 03/14/2019 0749   HGB 14.2 03/14/2019 0749   HCT 43.4 03/14/2019 0749   PLT 258 03/14/2019 0749   MCV 89.3 03/14/2019 0749   MCH 29.2 03/14/2019 0749   MCHC 32.7 03/14/2019 0749   RDW 14.8 03/14/2019 0749   LYMPHSABS 4.2 (H) 03/13/2019 1904   MONOABS 0.8 03/13/2019 1904   EOSABS 0.1 03/13/2019 1904   BASOSABS 0.0 03/13/2019 1904    CMP     Component Value Date/Time   NA 140 03/14/2019 0749   K 4.0 03/14/2019 0749   CL 101 03/14/2019 0749   CO2 28 03/14/2019 0749   GLUCOSE 111 (H) 03/14/2019 0749   BUN 20 03/14/2019 0749   CREATININE 1.09 (H) 03/14/2019 0749   CALCIUM 9.1 03/14/2019 0749   PROT 6.5 03/14/2019 0749   ALBUMIN 3.6 03/14/2019 0749   AST 17 03/14/2019 0749   ALT 19 03/14/2019 0749   ALKPHOS 34 (L) 03/14/2019 0749    BILITOT 0.8 03/14/2019 0749   GFRNONAA 51 (L) 03/14/2019 0749   GFRAA 60 (L) 03/14/2019 0749    Lipid Panel     Component Value Date/Time   CHOL 173 03/14/2019 0749   TRIG 96 03/14/2019 0749   HDL 45 03/14/2019 0749   CHOLHDL 3.8 03/14/2019 0749   VLDL 19 03/14/2019 0749   LDLCALC 109 (H) 03/14/2019 0749     Imaging I have reviewed the images obtained:  CT-scan of the brain IMPRESSION: 1. No CT evidence of large territory infarct or other acute intracranial abnormality. If there is persisting clinical concern for ischemia, consider MRI evaluation. 2. Postsurgical changes from prior left suboccipital craniectomy and partial resection of the posterior arch of C1. Gliosis of the left cerebellar hemisphere could reflect postsurgical changes from prior resection or infarct. Correlate  with patient history.   MRI examination of the brain IMPRESSION: 1. 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 2. 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 There is encephalomalacia in the left more than right inferior cerebellum. 3. Mild chronic small vessel ischemia in the cerebral white matter.  CTA of head and neck-pending  -LDL 109 - Hemoglobin A1c 6.4    Felicie Morn PA-C [facility] [phone]  M-F  (9:00 am- 5:00 PM)  03/14/2019, 8:40 AM   I have seen the patient and reviewed the above note.     Assessment:  This is a 73 year old female with history of hypertension prediabetes who suffered from transient right-handed numbness and weakness.  MRI confirmed small acute infarcts in the left precentral gyrus and inferior right temporal cortex suggestive of embolic disease.  Recommend #Transthoracic Echo, may need TEE and loop monitor  # Start patient on ASA 325mg  daily,  #Start  Atorvastatin 80 mg/other high intensity statin # BP goal:  permissive HTN upto 220/120 mmHg # HBAIC and Lipid profile # Telemetry monitoring # Frequent neuro checks # NPO until passes stroke swallow screen # please page stroke NP  Or  PA  Or MD from 8am -4 pm  as this patient from this time will be  followed by the stroke.   You can look them up on [email or web address]  Password [email or web address]    , MD [facility] [phone]  If 7pm- 7am, please page neurology on call as listed in AMION.

## 2019-03-14 NOTE — Evaluation (Signed)
Physical Therapy Evaluation Patient Details Name: Martha Kelly MRN: [id number] DOB: 1946-03-01 Today's Date: 03/14/2019   History of Present Illness  Pt is a 73 y/o female admitted secondary to R hand weakness and numbness. Found to have small acute infarcts in L precentral gyrus and inferior R temporal cortex. PMH includes prediabetes and HTN.   Clinical Impression  Pt admitted secondary to problem above with deficits below. Pt complaining of LLE sciatic pain and demonstrated antalgic gait. Performed gait training with cane and required min guard A. Feel pt would benefit from outpatient PT to address sciatic pain. Will continue to follow acutely to maximize functional mobility independence and safety.     Follow Up Recommendations Outpatient PT    Equipment Recommendations  Cane    Recommendations for Other Services       Precautions / Restrictions Precautions Precautions: None Restrictions Weight Bearing Restrictions: No      Mobility  Bed Mobility               General bed mobility comments: In bathroom upon entry.   Transfers Overall transfer level: Needs assistance Equipment used: None;Straight cane Transfers: Sit to/from Stand Sit to Stand: Supervision         General transfer comment: Supervision for safety. Increased time to come to standing secondary to pain in LLE.   Ambulation/Gait Ambulation/Gait assistance: Min guard Gait Distance (Feet): 50 Feet Assistive device: None;Straight cane Gait Pattern/deviations: Step-to pattern;Step-through pattern;Decreased stride length;Decreased weight shift to left;Antalgic Gait velocity: Decreased   General Gait Details: Slow, antalgic gait without use of AD. Decreased weightshift to LLE secondary to pain. Performed gait with cane within the room and pt performing with a 3point gait pattern. Educated about moving cane while taking step with LLE to make gait more fluid, however, pt unable. Will continue to  work on gait training with cane.   Stairs            Wheelchair Mobility    Modified Rankin (Stroke Patients Only) Modified Rankin (Stroke Patients Only) Pre-Morbid Rankin Score: No significant disability Modified Rankin: Moderately severe disability     Balance Overall balance assessment: Needs assistance Sitting-balance support: No upper extremity supported;Feet supported Sitting balance-Leahy Scale: Good     Standing balance support: No upper extremity supported;Single extremity supported;During functional activity Standing balance-Leahy Scale: Fair                               Pertinent Vitals/Pain Pain Assessment: Faces Faces Pain Scale: Hurts even more Pain Location: LLE  Pain Descriptors / Indicators: Grimacing;Guarding Pain Intervention(s): Limited activity within patient's tolerance;Monitored during session;Repositioned    Home Living Family/patient expects to be discharged to:: Private residence Living Arrangements: Children Available Help at Discharge: Family;Available 24 hours/day Type of Home: House Home Access: Level entry     Home Layout: Two level;Able to live on main level with bedroom/bathroom Home Equipment: None Additional Comments: She is currently staying with her daughter. Reports in a couple weeks, she plans to go back to her home in [place], [place].     Prior Function Level of Independence: Independent               Hand Dominance   Dominant Hand: Right    Extremity/Trunk Assessment   Upper Extremity Assessment Upper Extremity Assessment: Defer to OT evaluation;RUE deficits/detail RUE Deficits / Details: Reports R hand weakness has gotten better, however, continues to report some weakness.  Lower Extremity Assessment Lower Extremity Assessment: LLE deficits/detail LLE Deficits / Details: Pt reporting LLE sciatic pain.     Cervical / Trunk Assessment Cervical / Trunk Assessment: Normal  Communication    Communication: No difficulties  Cognition Arousal/Alertness: Awake/alert Behavior During Therapy: WFL for tasks assessed/performed Overall Cognitive Status: No family/caregiver present to determine baseline cognitive functioning                                        General Comments      Exercises     Assessment/Plan    PT Assessment Patient needs continued PT services  PT Problem List Decreased strength;Decreased balance;Decreased mobility;Decreased knowledge of use of DME;Pain       PT Treatment Interventions DME instruction;Gait training;Functional mobility training;Balance training;Therapeutic exercise;Therapeutic activities;Patient/family education    PT Goals (Current goals can be found in the Care Plan section)  Acute Rehab PT Goals Patient Stated Goal: to go home PT Goal Formulation: With patient Time For Goal Achievement: 03/28/19 Potential to Achieve Goals: Good    Frequency Min 4X/week   Barriers to discharge        Co-evaluation               AM-PAC PT "6 Clicks" Mobility  Outcome Measure Help needed turning from your back to your side while in a flat bed without using bedrails?: None Help needed moving from lying on your back to sitting on the side of a flat bed without using bedrails?: None Help needed moving to and from a bed to a chair (including a wheelchair)?: A Little Help needed standing up from a chair using your arms (e.g., wheelchair or bedside chair)?: None Help needed to walk in hospital room?: A Little Help needed climbing 3-5 steps with a railing? : A Little 6 Click Score: 21    End of Session   Activity Tolerance: Patient tolerated treatment well Patient left: in chair;with call bell/phone within reach;with nursing/sitter in room Nurse Communication: Mobility status PT Visit Diagnosis: Other abnormalities of gait and mobility (R26.89);Pain Pain - Right/Left: Left Pain - part of body: Leg    Time: 1151-1210 PT  Time Calculation (min) (ACUTE ONLY): 19 min   Charges:   PT Evaluation $PT Eval Low Complexity: 1 Low          Gladys Damme, PT, DPT  Acute Rehabilitation Services  Pager: [phone] Office: [phone]   Lehman Prom 03/14/2019, 12:58 PM

## 2019-03-14 NOTE — Progress Notes (Signed)
  Echocardiogram 2D Echocardiogram has been performed.  Onesimo Lingard G Shariff Lasky 03/14/2019, 3:20 PM

## 2019-03-14 NOTE — Progress Notes (Signed)
Patient seen and examined at bedside no acute distress and resting comfortably.  She was admitted after midnight last night found to have acute stroke.  Patient reports that she has had improvement in her right upper extremity hand numbness and resolution of her symptoms.  She was seen by neurology today who recommended starting aspirin, statin, echo and consideration for loop recorder to monitor for paroxysmal atrial fibrillation.    Physical Exam Constitutional:      Appearance: Normal appearance.  HENT:     Head: Normocephalic and atraumatic.     Nose: Nose normal.     Mouth/Throat:     Mouth: Mucous membranes are moist.  Eyes:     Extraocular Movements: Extraocular movements intact.  Neck:     Musculoskeletal: Normal range of motion.  Cardiovascular:     Rate and Rhythm: Normal rate and regular rhythm.  Pulmonary:     Effort: Pulmonary effort is normal.     Breath sounds: Normal breath sounds.  Abdominal:     General: Abdomen is flat.     Palpations: Abdomen is soft.  Musculoskeletal: Normal range of motion.        General: No swelling or tenderness.  Skin:    General: Skin is warm.  Neurological:     General: No focal deficit present.     Mental Status: She is alert. Mental status is at baseline.     Cranial Nerves: No cranial nerve deficit.     Sensory: No sensory deficit.     Motor: No weakness.  Psychiatric:        Mood and Affect: Mood normal.        Behavior: Behavior normal.        Thought Content: Thought content normal.        Judgment: Judgment normal.    Plan Continue to monitor and continue with neurology recommendations.  Follow-up with PT OT.  Plan discussed with daughter via face time at bedside.  Harold Hedge, MD Pager [phone]

## 2019-03-14 NOTE — ED Notes (Signed)
Patient transported to MRI 

## 2019-03-14 NOTE — Progress Notes (Signed)
  Speech Language Pathology Treatment: Cognitive-Linquistic  Patient Details Name: Martha Kelly MRN: [id number] DOB: Mar 28, 1946 Today's Date: 03/14/2019 Time: 0951-1006 SLP Time Calculation (min) (ACUTE ONLY): 15 min  Assessment / Plan / Recommendation Clinical Impression  Martha Kelly was seen for cognitive-linguistic treatment and was cooperative throughout the session. However, she requested that it be abbreviated so that she could rest since she did not sleep last night. She achieved 60% accuracy with time management problems increasing to 100% with repetition and min cues. With problem solving related to safety she achieved 100% accuracy. She completed a 3-task sequencing mental manipulation activity with 80% accuracy increasing to 100% with min cues. She verbally sequenced activities related to cooking with min cues. SLP will continue to follow Martha Kelly.    HPI HPI: Martha Kelly is a 73 y.o. 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      SLP Plan  Continue with current plan of care  Patient needs continued Speech Lanaguage Pathology Services    Recommendations                   Follow up Recommendations: Other (comment)(Continued SLP services at level of care recommended by Martha Kelly/OT) SLP Visit Diagnosis: Cognitive communication deficit (W65.681) Plan: Continue with current plan of care       Haylyn Halberg I. Hardin Negus, [place], [place] Office number [phone] Pager [place] 03/14/2019, 10:36 AM

## 2019-03-14 NOTE — Consult Note (Addendum)
Neurology Consultation  Reason for Consult: Transient right-sided weakness Referring Physician: Dr. Hal Hope  History is obtained from: Patient and chart  HPI: Martha Kelly is a 73 y.o. female with history of prediabetes and hypertension.  Patient states that she does not take aspirin.  yesterday morning at approximately 9:30 in the morning she noticed that her right hand felt tingly.  When she went to grab her fork to start eating breakfast she noticed that she did not have any sensation in her right hand, in addition, her hand was not working as well as it normally would.  She has been on a no meat, no carbs, and no sweets diet for about a month.  Her daughter thought possibly that this could be the reason why she is having these symptoms. For that reason, she went out and baught her a salad with some chicken.  At that point she felt the symptoms got slightly worse, thus she came to the emergency department.  By 4:00 in the afternoon she states that her symptoms had fully dissipated.  Patient states she is never had symptoms like this in the past.  She did not feel significantly weak in the right arm, and had no numbness or tingling in that right arm, no visual deficits, no swallowing difficulties, no left-sided symptoms, no diplopia.   ED course CT head   Work up that has been done: CT head and MRI of brain   LKW: 9:30 AM on 03/13/2019 tpa given?: no, symptoms resolved Premorbid modified Rankin scale (mRS): 0 NIH stroke score: 0   ROS: A 14 point ROS was performed and is negative except as noted in the HPI.   Past Medical History:  Diagnosis Date  . Hypertension   . Prediabetes     Family History  Problem Relation Age of Onset  . CAD Mother   . CAD Father   . Diabetes Mellitus II Maternal Uncle    Social History:   reports that she has never smoked. She has never used smokeless tobacco. No history on file for alcohol and drug.  Medications  Current  Facility-Administered Medications:  .   stroke: mapping our early stages of recovery book, , Does not apply, Once, Rise Patience, MD .  0.9 %  sodium chloride infusion, , Intravenous, Continuous, Rise Patience, MD .  acetaminophen (TYLENOL) tablet 650 mg, 650 mg, Oral, Q4H PRN **OR** acetaminophen (TYLENOL) solution 650 mg, 650 mg, Per Tube, Q4H PRN **OR** acetaminophen (TYLENOL) suppository 650 mg, 650 mg, Rectal, Q4H PRN, Rise Patience, MD .  aspirin suppository 300 mg, 300 mg, Rectal, Daily **OR** aspirin tablet 325 mg, 325 mg, Oral, Daily, Rise Patience, MD .  cholecalciferol (VITAMIN D3) tablet 2,000 Units, 2,000 Units, Oral, Daily, Rise Patience, MD .  enoxaparin (LOVENOX) injection 40 mg, 40 mg, Subcutaneous, Q24H, Kakrakandy, Arshad N, MD .  insulin aspart (novoLOG) injection 0-9 Units, 0-9 Units, Subcutaneous, TID WC, Rise Patience, MD .  lisinopril (ZESTRIL) tablet 20 mg, 20 mg, Oral, Daily, Rise Patience, MD .  pravastatin (PRAVACHOL) tablet 40 mg, 40 mg, Oral, q1800, Rise Patience, MD   Exam: Current vital signs: BP (!) 142/74 (BP Location: Left Arm)   Pulse 68   Temp 98.5 F (36.9 C) (Oral)   Resp 18   Ht 5\' 5"  (1.651 m)   Wt 115.7 kg   SpO2 98%   BMI 42.43 kg/m  Vital signs in last 24 hours: Temp:  [98.2  F (36.8 C)-98.9 F (37.2 C)] 98.5 F (36.9 C) (10/01 0811) Pulse Rate:  [68-92] 68 (10/01 0811) Resp:  [13-18] 18 (10/01 0811) BP: (116-142)/(63-91) 142/74 (10/01 0811) SpO2:  [94 %-100 %] 98 % (10/01 0811) Weight:  [115.7 kg] 115.7 kg (10/01 0250)  Physical Exam  Constitutional: Appears well-developed and well-nourished.  Psych: Affect appropriate to situation Eyes: No scleral injection HENT: No OP obstrucion Head: Normocephalic.  Cardiovascular: Normal rate and regular rhythm.  Respiratory: Effort normal, non-labored breathing GI: Soft.  No distension. There is no tenderness.  Skin:  WDI  Neuro: Mental Status: Patient is awake, alert, oriented to person, place, month, year, and situation. Patient is able to give a clear and coherent history. No signs of aphasia or neglect Cranial Nerves: II: Visual Fields are full.  III,IV, VI: EOMI without ptosis or diploplia. Pupils equal, round and reactive to light V: Facial sensation is symmetric to temperature VII: Facial movement is symmetric.  VIII: hearing is intact to voice X: Palat elevates symmetrically XI: Shoulder shrug is symmetric. XII: tongue is midline without atrophy or fasciculations.  Motor: Tone is normal. Bulk is normal. 5/5 strength was present in all four extremities.  Patient does orbit her left hand around her right hand.  No drift is noted bilaterally Sensory: Sensation is symmetric to light touch and temperature in the arms and legs. Deep Tendon Reflexes: 2+ and symmetric in the biceps and patellae.  Plantars: Toes are downgoing bilaterally.  Cerebellar: FNF and HKS are intact bilaterally  Labs I have reviewed labs in epic and the results pertinent to this consultation are:   CBC    Component Value Date/Time   WBC 7.4 03/14/2019 0749   RBC 4.86 03/14/2019 0749   HGB 14.2 03/14/2019 0749   HCT 43.4 03/14/2019 0749   PLT 258 03/14/2019 0749   MCV 89.3 03/14/2019 0749   MCH 29.2 03/14/2019 0749   MCHC 32.7 03/14/2019 0749   RDW 14.8 03/14/2019 0749   LYMPHSABS 4.2 (H) 03/13/2019 1904   MONOABS 0.8 03/13/2019 1904   EOSABS 0.1 03/13/2019 1904   BASOSABS 0.0 03/13/2019 1904    CMP     Component Value Date/Time   NA 140 03/14/2019 0749   K 4.0 03/14/2019 0749   CL 101 03/14/2019 0749   CO2 28 03/14/2019 0749   GLUCOSE 111 (H) 03/14/2019 0749   BUN 20 03/14/2019 0749   CREATININE 1.09 (H) 03/14/2019 0749   CALCIUM 9.1 03/14/2019 0749   PROT 6.5 03/14/2019 0749   ALBUMIN 3.6 03/14/2019 0749   AST 17 03/14/2019 0749   ALT 19 03/14/2019 0749   ALKPHOS 34 (L) 03/14/2019 0749    BILITOT 0.8 03/14/2019 0749   GFRNONAA 51 (L) 03/14/2019 0749   GFRAA 60 (L) 03/14/2019 0749    Lipid Panel     Component Value Date/Time   CHOL 173 03/14/2019 0749   TRIG 96 03/14/2019 0749   HDL 45 03/14/2019 0749   CHOLHDL 3.8 03/14/2019 0749   VLDL 19 03/14/2019 0749   LDLCALC 109 (H) 03/14/2019 0749     Imaging I have reviewed the images obtained:  CT-scan of the brain IMPRESSION: 1. No CT evidence of large territory infarct or other acute intracranial abnormality. If there is persisting clinical concern for ischemia, consider MRI evaluation. 2. Postsurgical changes from prior left suboccipital craniectomy and partial resection of the posterior arch of C1. Gliosis of the left cerebellar hemisphere could reflect postsurgical changes from prior resection or infarct. Correlate   with patient history.   MRI examination of the brain IMPRESSION: 1. 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 2. 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 There is encephalomalacia in the left more than right inferior cerebellum. 3. Mild chronic small vessel ischemia in the cerebral white matter.  CTA of head and neck-pending  -LDL 109 - Hemoglobin A1c 6.4    Felicie Morn PA-C [facility] [phone]  M-F  (9:00 am- 5:00 PM)  03/14/2019, 8:40 AM   I have seen the patient and reviewed the above note.     Assessment:  This is a 73 year old female with history of hypertension prediabetes who suffered from transient right-handed numbness and weakness.  MRI confirmed small acute infarcts in the left precentral gyrus and inferior right temporal cortex suggestive of embolic disease.  Recommend #Transthoracic Echo, may need TEE and loop monitor  # Start patient on ASA 325mg  daily,  #Start  Atorvastatin 80 mg/other high intensity statin # BP goal:  permissive HTN upto 220/120 mmHg # HBAIC and Lipid profile # Telemetry monitoring # Frequent neuro checks # NPO until passes stroke swallow screen # please page stroke NP  Or  PA  Or MD from 8am -4 pm  as this patient from this time will be  followed by the stroke.   You can look them up on [email or web address]  Password [email or web address]    , MD [facility] [phone]  If 7pm- 7am, please page neurology on call as listed in AMION.

## 2019-03-14 NOTE — ED Provider Notes (Signed)
TIME SEEN: 3:03 AM  CHIEF COMPLAINT: weakness in RUE  HPI: Patient is a 73 year old female with history of hypertension, diabetes, hyperlipidemia who presents to the emergency department today with complaints of right hand weakness that started around 9:30 AM.  States she was trying to eat and she could not hold a utensil.  She felt like her arm was not as coordinated and the hand was weak.  She denies any numbness or tingling despite nursing notes.  States the symptoms lasted until approximately 4 PM and have now resolved.  No other weakness.  No headache or head injury.  No chest pain or shortness of breath.  No history of previous CVA or TIA.  Has never had similar symptoms.  States she has been fasting for the past 11 days for religious reasons.  She is still eating food and drinking water but avoiding carbohydrates, sweets.  She states her symptoms seem to improve after she ate a salad with grilled chicken at 4 PM.  She lives in [place] is here visiting.  ROS: See HPI Constitutional: no fever  Eyes: no drainage  ENT: no runny nose   Cardiovascular:  no chest pain  Resp: no SOB  GI: no vomiting GU: no dysuria Integumentary: no rash  Allergy: no hives  Musculoskeletal: no leg swelling  Neurological: no slurred speech ROS otherwise negative  PAST MEDICAL HISTORY/PAST SURGICAL HISTORY:  HTN, NIDDM, HLD  MEDICATIONS:  Prior to Admission medications   Not on File    ALLERGIES:  No Known Allergies  SOCIAL HISTORY:  No history of tobacco or alcohol use.  FAMILY HISTORY: Noncontributory.  EXAM: BP (!) 132/91 (BP Location: Right Arm)   Pulse 71   Temp 98.2 F (36.8 C) (Oral)   Resp 18   Ht 5\' 5"  (1.651 m)   Wt 115.7 kg   SpO2 98%   BMI 42.43 kg/m  CONSTITUTIONAL: Alert and oriented and responds appropriately to questions. Well-appearing; well-nourished HEAD: Normocephalic EYES: Conjunctivae clear, pupils appear equal, EOMI ENT: normal nose; moist mucous  membranes NECK: Supple, no meningismus, no nuchal rigidity, no LAD  CARD: RRR; S1 and S2 appreciated; no murmurs, no clicks, no rubs, no gallops RESP: Normal chest excursion without splinting or tachypnea; breath sounds clear and equal bilaterally; no wheezes, no rhonchi, no rales, no hypoxia or respiratory distress, speaking full sentences ABD/GI: Normal bowel sounds; non-distended; soft, non-tender, no rebound, no guarding, no peritoneal signs, no hepatosplenomegaly BACK:  The back appears normal and is non-tender to palpation, there is no CVA tenderness EXT: Normal ROM in all joints; non-tender to palpation; no edema; normal capillary refill; no cyanosis, no calf tenderness or swelling, 2+ radial pulses bilaterally    SKIN: Normal color for age and race; warm; no rash NEURO: Moves all extremities equally, strength 5/5 in all 4 extremities, cranial nerves II through XII intact, normal speech, normal grip strength bilaterally PSYCH: The patient's mood and manner are appropriate. Grooming and personal hygiene are appropriate.  MEDICAL DECISION MAKING: Patient here with weakness in the right upper extremity that started at 9:30 AM this morning and lasted till approximately 4 PM.  Now neurologically intact with NIH stroke scale of 0.  She states that she feels like it was only her hand that was weak but she felt she did not have the same coordination that she normally would have and was unable to hold a utensil and was scared to even pick up her 74-month-old granddaughter.  She does have  multiple risk factors for CVA and TIA.  Labs obtained in triage are unremarkable but head imaging was not obtained.  I have low suspicion for intracranial hemorrhage.  Will obtain head CT and then discuss with neurology for further recommendations.  She may need admission for TIA work-up.  Patient not a TPA candidate at this time given symptoms have resolved.  ED PROGRESS: 3:25 AM  Discussed with Dr. Laurence Slate with  neurology.  He agrees this patient needs stroke/TIA work-up.  Agrees with head CT and medical admission.  Would like for MRI brain without contrast also be ordered.  4:05 AM  Pt's head CT shows no acute abnormality.  Will discuss with hospitalist for admission.   4:24 AM Discussed patient's case with hospitalist, Dr. Eilene Ghazi.  I have recommended admission and patient (and family if present) agree with this plan. Admitting physician will place admission orders.   I reviewed all nursing notes, vitals, pertinent previous records, EKGs, lab and urine results, imaging (as available).   Martha Kelly was evaluated in Emergency Department on 03/14/2019 for the symptoms described in the history of present illness. She was evaluated in the context of the global COVID-19 pandemic, which necessitated consideration that the patient might be at risk for infection with the SARS-CoV-2 virus that causes COVID-19. Institutional protocols and algorithms that pertain to the evaluation of patients at risk for COVID-19 are in a state of rapid change based on information released by regulatory bodies including the CDC and federal and state organizations. These policies and algorithms were followed during the patient's care in the ED.     EKG Interpretation  Date/Time:  Wednesday March 13 2019 19:03:44 EDT Ventricular Rate:  93 PR Interval:  154 QRS Duration: 68 QT Interval:  326 QTC Calculation: 405 R Axis:   39 Text Interpretation:  Normal sinus rhythm Low voltage QRS Cannot rule out Anterior infarct , age undetermined Abnormal ECG No old tracing to compare Confirmed by Ward, Baxter Hire [phone]) on 03/14/2019 2:17:32 AM         Ward, Layla Maw, DO 03/14/19 [id number]

## 2019-03-14 NOTE — ED Notes (Signed)
Attempted to call report to call back in 10-15 min

## 2019-03-14 NOTE — ED Notes (Addendum)
Pt. Returned from MR to go up to 3W at this time

## 2019-03-14 NOTE — Evaluation (Signed)
Speech Language Pathology Evaluation Patient Details Name: Martha Kelly MRN: [id number] DOB: 30-Oct-1945 Today's Date: 03/14/2019 Time: 0930-0950 SLP Time Calculation (min) (ACUTE ONLY): 20 min  Problem List:  Patient Active Problem List   Diagnosis Date Noted  . TIA (transient ischemic attack) 03/14/2019  . Essential hypertension 03/14/2019  . HLD (hyperlipidemia) 03/14/2019  . 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 Hypertension   . Prediabetes    Past Surgical History:  Past Surgical History:  Procedure Laterality Date  . ABDOMINAL HYSTERECTOMY    . NECK SURGERY     HPI:  Pt is a 73 y.o. 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   Assessment / Plan / Recommendation Clinical Impression  Pt reported that she lives in [place], [place] but has been staying with her daughter for the past five months since her grandchildren were born. 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 Pt initially denied any acute changes in these areas but at the end of the assessment indicated that she was "slow" and is usually "pretty sharp". She stated that she had not slept last night and the impact of this on her performance is considered.   [facility] Cognitive Assessment 8.1 was completed to evaluate the pt's cognitive-linguistic skills. She achieved a score of 24/30 which is  slightly below the normal limits of 26 or more out of 30 and is suggestive of a mild impairment. She demonstrated deficits in the areas of executive function, complex problem solving, attention, mental manipulation, and delayed recall. Skilled SLP services are clinically indicated at this time to improve cognition. Pt, and nursing were educated regarding results and recommendations; both parties verbalized understanding as well as agreement with plan of care.    SLP Assessment  SLP Recommendation/Assessment: Patient needs continued Speech Lanaguage Pathology Services SLP Visit Diagnosis: Cognitive communication deficit (R41.841)    Follow Up Recommendations  Other (comment)(Continued SLP services at level of care recommended by PT/OT)    Frequency and Duration min 2x/week  2 weeks      SLP Evaluation Cognition  Overall Cognitive Status: Impaired/Different from baseline Arousal/Alertness: Awake/alert Orientation Level: Oriented X4 Attention: Focused;Sustained Focused Attention: Appears intact(Vigilance WNL: 1/1) Sustained Attention: Impaired Sustained Attention Impairment: Verbal complex(Serial 7s: 2/3) Memory: Impaired Memory Impairment: Storage deficit;Retrieval deficit;Decreased recall of new information(Immediate: 5/5; delayed: 4/5; with cue: 1/1) Awareness: Appears intact Problem Solving: Impaired Problem Solving Impairment: Verbal complex Executive Function: Sequencing;Reasoning;Organizing Reasoning: Appears intact(Abstraction: 2/2) Sequencing: Appears intact(Clock drawing: 3/3) Organizing: Appears intact(Backward digit span: 1/1; Forward digit span: 1/1)       Comprehension  Auditory Comprehension Overall Auditory Comprehension: Appears within functional limits for tasks assessed Yes/No Questions: Within Functional Limits Commands: Impaired Complex Commands: (Trail completion: 0/1) Conversation: Complex Reading Comprehension Reading Status: Not tested    Expression  Expression Primary Mode of Expression: Verbal Verbal Expression Overall Verbal Expression: Appears within functional limits for tasks assessed Initiation: No impairment Level of Generative/Spontaneous Verbalization:  Conversation Repetition: Impaired Level of Impairment: Sentence level(0/2 due to minor errors) Naming: Impairment Responsive: Not tested Confrontation: (2/3 (named camel as giraffe)) Convergent: Not tested Divergent: (1/1) Pragmatics: No impairment Written Expression Dominant Hand: Right Written Expression: (Copying cube: 1/1)   Oral / Motor  Oral Motor/Sensory Function Overall Oral Motor/Sensory Function: Within functional limits Motor Speech Overall Motor Speech: Appears within functional limits for tasks assessed Respiration: Within functional limits Phonation: Normal Resonance: Within functional limits Articulation: Within functional limitis Intelligibility: Intelligible Motor Planning: Witnin functional limits Motor Speech Errors: Not applicable   Martha Kelly I. Martha Kelly, Martha Kelly, Martha Kelly Office number [phone] Pager Martha Kelly 03/14/2019, 10:19 AM

## 2019-03-15 ENCOUNTER — Encounter (HOSPITAL_COMMUNITY)
Admission: EM | Disposition: A | Discharge status: Home Health | Payer: Self-pay | Source: Home / Self Care | Attending: Internal Medicine

## 2019-03-15 ENCOUNTER — Inpatient Hospital Stay (HOSPITAL_COMMUNITY): Payer: Medicare Other

## 2019-03-15 ENCOUNTER — Other Ambulatory Visit (HOSPITAL_COMMUNITY): Payer: Medicare Other

## 2019-03-15 ENCOUNTER — Encounter (HOSPITAL_COMMUNITY): Payer: Self-pay | Admitting: Emergency Medicine

## 2019-03-15 DIAGNOSIS — I340 Nonrheumatic mitral (valve) insufficiency: Secondary | ICD-10-CM

## 2019-03-15 DIAGNOSIS — Q211 Atrial septal defect: Secondary | ICD-10-CM

## 2019-03-15 DIAGNOSIS — I639 Cerebral infarction, unspecified: Secondary | ICD-10-CM

## 2019-03-15 DIAGNOSIS — I6389 Other cerebral infarction: Secondary | ICD-10-CM

## 2019-03-15 DIAGNOSIS — I361 Nonrheumatic tricuspid (valve) insufficiency: Secondary | ICD-10-CM

## 2019-03-15 DIAGNOSIS — Q2112 Patent foramen ovale: Secondary | ICD-10-CM

## 2019-03-15 HISTORY — PX: BUBBLE STUDY: SHX6837

## 2019-03-15 HISTORY — PX: TEE WITHOUT CARDIOVERSION: SHX5443

## 2019-03-15 HISTORY — PX: LOOP RECORDER INSERTION: EP1214

## 2019-03-15 LAB — GLUCOSE, CAPILLARY
Glucose-Capillary: 115 mg/dL — ABNORMAL HIGH (ref 70–99)
Glucose-Capillary: 108 mg/dL — ABNORMAL HIGH (ref 70–99)
Glucose-Capillary: 91 mg/dL (ref 70–99)

## 2019-03-15 SURGERY — LOOP RECORDER INSERTION

## 2019-03-15 SURGERY — ECHOCARDIOGRAM, TRANSESOPHAGEAL
Anesthesia: Moderate Sedation

## 2019-03-15 MED ORDER — ASPIRIN 81 MG PO TBEC: 81.0000 mg | DELAYED_RELEASE_TABLET | Freq: Every day | ORAL | Status: DC

## 2019-03-15 MED ORDER — ATORVASTATIN CALCIUM 40 MG PO TABS: 40.0000 mg | ORAL_TABLET | Freq: Every day | ORAL | 3 refills | Status: DC

## 2019-03-15 MED ORDER — LIDOCAINE-EPINEPHRINE 1 %-1:100000 IJ SOLN
INTRAMUSCULAR | Status: AC
  Filled 2019-03-15: qty 1

## 2019-03-15 MED ORDER — FENTANYL CITRATE (PF) 100 MCG/2ML IJ SOLN
INTRAMUSCULAR | Status: AC
  Filled 2019-03-15: qty 4

## 2019-03-15 MED ORDER — CLOPIDOGREL BISULFATE 75 MG PO TABS: 75.0000 mg | ORAL_TABLET | Freq: Every day | ORAL | 0 refills | Status: AC

## 2019-03-15 MED ORDER — STROKE: EARLY STAGES OF RECOVERY BOOK
Status: AC
  Administered 2019-03-15: 12:00:00
  Filled 2019-03-15: qty 1

## 2019-03-15 MED ORDER — LIDOCAINE-EPINEPHRINE 1 %-1:100000 IJ SOLN
INTRAMUSCULAR | Status: DC | PRN
  Administered 2019-03-15: 30 mL

## 2019-03-15 MED ORDER — BUTAMBEN-TETRACAINE-BENZOCAINE 2-2-14 % EX AERO
INHALATION_SPRAY | CUTANEOUS | Status: DC | PRN
  Administered 2019-03-15: 11:00:00 2 via TOPICAL

## 2019-03-15 MED ORDER — MIDAZOLAM HCL (PF) 5 MG/ML IJ SOLN
INTRAMUSCULAR | Status: AC
  Filled 2019-03-15: qty 2

## 2019-03-15 MED ORDER — SODIUM CHLORIDE 0.9 % IV SOLN
INTRAVENOUS | Status: DC
  Administered 2019-03-15: 10:00:00 via INTRAVENOUS

## 2019-03-15 MED ORDER — CLOPIDOGREL BISULFATE 75 MG PO TABS
75.0000 mg | ORAL_TABLET | Freq: Every day | ORAL | Status: DC
  Administered 2019-03-15: 75 mg via ORAL
  Filled 2019-03-15: qty 1

## 2019-03-15 MED ORDER — MIDAZOLAM HCL (PF) 10 MG/2ML IJ SOLN
INTRAMUSCULAR | Status: DC | PRN
  Administered 2019-03-15 (×2): 2 mg via INTRAVENOUS
  Administered 2019-03-15: 1 mg via INTRAVENOUS

## 2019-03-15 MED ORDER — FENTANYL CITRATE (PF) 100 MCG/2ML IJ SOLN
INTRAMUSCULAR | Status: DC | PRN
  Administered 2019-03-15 (×3): 25 ug via INTRAVENOUS

## 2019-03-15 MED ORDER — ASPIRIN EC 81 MG PO TBEC: 81.0000 mg | DELAYED_RELEASE_TABLET | Freq: Every day | ORAL | Status: DC

## 2019-03-15 SURGICAL SUPPLY — 2 items
MONITOR REVEAL LINQ II (Prosthesis & Implant Heart) ×1 IMPLANT
PACK LOOP INSERTION (CUSTOM PROCEDURE TRAY) ×2 IMPLANT

## 2019-03-15 NOTE — Interval H&P Note (Signed)
History and Physical Interval Note:  03/15/2019 10:32 AM  Martha Kelly  has presented today for surgery, with the diagnosis of stroke.  The various methods of treatment have been discussed with the patient and family. After consideration of risks, benefits and other options for treatment, the patient has consented to  Procedure(s): TRANSESOPHAGEAL ECHOCARDIOGRAM (TEE) (N/A) as a surgical intervention.  The patient's history has been reviewed, patient examined, no change in status, stable for surgery.  I have reviewed the patient's chart and labs.  Questions were answered to the patient's satisfaction.     Kirk Ruths

## 2019-03-15 NOTE — Progress Notes (Signed)
Physical Therapy Treatment Note  Patient seen for mobility progression. Current plan remains appropriate. CM/SW present during session and pt prefers HHPT due to difficulty with transportation.  Pt has SPC in room at time of session. Continue to progress as tolerated.     03/15/19 1642  PT Visit Information  Last PT Received On 03/15/19  Assistance Needed +1  History of Present Illness Pt is a 73 y/o female admitted secondary to R hand weakness and numbness. Found to have small acute infarcts in L precentral gyrus and inferior R temporal cortex. PMH includes prediabetes and HTN.   Subjective Data  Patient Stated Goal to go home  Precautions  Precautions None  Restrictions  Weight Bearing Restrictions No  Pain Assessment  Pain Assessment Faces  Faces Pain Scale 2  Pain Location LLE   Pain Descriptors / Indicators Grimacing;Guarding  Pain Intervention(s) Monitored during session;Repositioned  Cognition  Arousal/Alertness Awake/alert  Behavior During Therapy WFL for tasks assessed/performed  Overall Cognitive Status Within Functional Limits for tasks assessed  Bed Mobility  Overal bed mobility Modified Independent  General bed mobility comments use of rail  Transfers  Overall transfer level Needs assistance  Equipment used None  Transfers Sit to/from Stand  Sit to Stand Supervision  General transfer comment supervision for safety   Ambulation/Gait  Ambulation/Gait assistance Min assist  Gait Distance (Feet) 20 Feet  Assistive device None (pt reaching for objects to hold onto)  Gait Pattern/deviations Step-through pattern;Decreased stride length;Wide base of support  General Gait Details assist to steady; distance limited due to transport present to take pt to cath lab  Gait velocity Decreased  Balance  Overall balance assessment Needs assistance  Sitting-balance support No upper extremity supported;Feet supported  Sitting balance-Leahy Scale Good  Standing balance support  No upper extremity supported;Single extremity supported;During functional activity  Standing balance-Leahy Scale Fair  PT - End of Session  Equipment Utilized During Treatment Gait belt  Activity Tolerance Patient tolerated treatment well  Patient left with call bell/phone within reach;in bed;Other (comment) (transport present)  Nurse Communication Mobility status   PT - Assessment/Plan  PT Plan Current plan remains appropriate  PT Visit Diagnosis Other abnormalities of gait and mobility (R26.89);Pain  Pain - Right/Left Left  Pain - part of body Leg  PT Frequency (ACUTE ONLY) Min 4X/week  Follow Up Recommendations Outpatient PT  PT equipment Cane  AM-PAC PT "6 Clicks" Mobility Outcome Measure (Version 2)  Help needed turning from your back to your side while in a flat bed without using bedrails? 4  Help needed moving from lying on your back to sitting on the side of a flat bed without using bedrails? 4  Help needed moving to and from a bed to a chair (including a wheelchair)? 3  Help needed standing up from a chair using your arms (e.g., wheelchair or bedside chair)? 4  Help needed to walk in hospital room? 3  Help needed climbing 3-5 steps with a railing?  3  6 Click Score 21  Consider Recommendation of Discharge To: Home with no services  PT Goal Progression  Progress towards PT goals Progressing toward goals  PT Time Calculation  PT Start Time (ACUTE ONLY) 1550  PT Stop Time (ACUTE ONLY) 1606  PT Time Calculation (min) (ACUTE ONLY) 16 min  PT General Charges  $$ ACUTE PT VISIT 1 Visit  PT Treatments  $Gait Training 8-22 mins   Earney Navy, PTA Acute Rehabilitation Services Pager: [phone] Office: [phone]

## 2019-03-15 NOTE — Progress Notes (Signed)
PT Cancellation Note  Patient Details Name: Martha Kelly MRN: [id number] DOB: Nov 01, 1945   Cancelled Treatment:    Reason Eval/Treat Not Completed: Patient at procedure or test/unavailable patient leaving for procedure, unable to participate in PT this morning. Will attempt to try back if schedule allows and if patient is appropriate following procedure.    Deniece Ree PT, DPT, CBIS  Supplemental Physical Therapist [facility]    Pager [phone] Acute Rehab Office [phone]

## 2019-03-15 NOTE — Progress Notes (Addendum)
  Echocardiogram Transesophageal echocardiogram has been performed.  Martha Kelly 03/15/2019, 11:35 AM

## 2019-03-15 NOTE — Progress Notes (Signed)
LE venous duplex       has been completed. Preliminary results can be found under CV proc through chart review. Cassi Jenne, BS, RDMS, RVT   

## 2019-03-15 NOTE — Progress Notes (Signed)
    Transesophageal Echocardiogram Note  Martha Kelly [id number] 09-21-1945  Procedure: Transesophageal Echocardiogram Indications: CVA  Procedure Details Consent: Obtained Time Out: Verified patient identification, verified procedure, site/side was marked, verified correct patient position, special equipment/implants available, Radiology Safety Procedures followed,  medications/allergies/relevent history reviewed, required imaging and test results available.  Performed  Medications:  During this procedure the patient is administered a total of Versed 5 mg and Fentanyl 75 mcg  to achieve and maintain moderate conscious sedation.  The patient's heart rate, blood pressure, and oxygen saturation are monitored continuously during the procedure. The period of conscious sedation is 30 minutes, of which I was present face-to-face 100% of this time.  Normal LV function; mild MR and TR; atrial septal aneurysm; positive saline microcavitation study.   Complications: No apparent complications Patient did tolerate procedure well.  Kirk Ruths, MD

## 2019-03-15 NOTE — Progress Notes (Signed)
STROKE TEAM PROGRESS NOTE   INTERVAL HISTORY I have personally reviewed history of presenting illness with the patient, electronic medical records and imaging films in PACS.  She presented with sudden onset of weakness and paresthesias in the right hand which appears to have improved.  MRI scan shows a tiny punctate left frontal cortical infarct.  CT angiogram did not show significant extracranial or intracranial large vessel stenosis.  She had a TEE this morning which shows atrial septal aneurysm and PFO but no clot.  Lower extremity venous Dopplers were also obtained which showed no evidence of DVT.  LDL cholesterol is elevated at 109 mg percent and hemoglobin A1c is borderline at 6.4  Vitals:   03/15/19 1123 03/15/19 1135 03/15/19 1151 03/15/19 1318  BP: 131/62 122/68 116/81 (!) 136/120  Pulse:  69 76 94  Resp: 20 18 18 20   Temp:   98 F (36.7 C) 97.6 F (36.4 C)  TempSrc:   Oral Oral  SpO2: 98% 97% 100% 99%  Weight:      Height:        CBC:  Recent Labs  Lab 03/13/19 1904 03/13/19 1916 03/14/19 0749  WBC 10.2  --  7.4  NEUTROABS 5.0  --   --   HGB 14.4 15.3* 14.2  HCT 44.1 45.0 43.4  MCV 90.4  --  89.3  PLT 264  --  517    Basic Metabolic Panel:  Recent Labs  Lab 03/13/19 1904 03/13/19 1916 03/14/19 0749  NA 138 140 140  K 4.0 3.9 4.0  CL 100 102 101  CO2 26  --  28  GLUCOSE 133* 127* 111*  BUN 22 25* 20  CREATININE 1.24* 1.10* 1.09*  CALCIUM 9.2  --  9.1   Lipid Panel:     Component Value Date/Time   CHOL 173 03/14/2019 0749   TRIG 96 03/14/2019 0749   HDL 45 03/14/2019 0749   CHOLHDL 3.8 03/14/2019 0749   VLDL 19 03/14/2019 0749   LDLCALC 109 (H) 03/14/2019 0749   HgbA1c:  Lab Results  Component Value Date   HGBA1C 6.4 (H) 03/14/2019   Urine Drug Screen: No results found for: LABOPIA, COCAINSCRNUR, LABBENZ, AMPHETMU, THCU, LABBARB  Alcohol Level No results found for: ETH  IMAGING Ct Angio Head W Or Wo Contrast  Result Date:  03/14/2019 CLINICAL DATA:  Stroke workup EXAM: CT ANGIOGRAPHY HEAD AND NECK TECHNIQUE: Multidetector CT imaging of the head and neck was performed using the standard protocol during bolus administration of intravenous contrast. Multiplanar CT image reconstructions and MIPs were obtained to evaluate the vascular anatomy. Carotid stenosis measurements (when applicable) are obtained utilizing NASCET criteria, using the distal internal carotid diameter as the denominator. CONTRAST:  57mL OMNIPAQUE IOHEXOL 350 MG/ML SOLN COMPARISON:  Head CT and brain MRI earlier today FINDINGS: CTA NECK FINDINGS Aortic arch: Atherosclerotic calcification.  Three vessel branching. Right carotid system: Vessels are smooth and widely patent. No atheromatous changes noted. Left carotid system: Vessels are smooth and widely patent. No noted atheromatous changes. Vertebral arteries: No proximal subclavian stenosis or atherosclerosis. Tiny calcified plaque seen at the left vertebral origin. Both vertebral arteries are smooth and widely patent to the dura. Skeleton: Suboccipital craniectomy and C1 posterior ring resection presumably for Chiari malformation as noted on prior brain MRI. Disc narrowing and endplate ridging with a central disc protrusion at C4-5 with calcified annulus, presumably flattening the cord. Other neck: Medial ization of the left focal fold compared to the right. Upper chest:  Negative Review of the MIP images confirms the above findings CTA HEAD FINDINGS Anterior circulation: Atherosclerotic plaque on the carotid siphons. No branch occlusion, aneurysm, or flow limiting stenosis. Posterior circulation: Symmetric vertebral arteries. No branch occlusion, beading, or aneurysm. No flow limiting stenosis. Possible mild atheromatous irregularity of posterior cerebral arteries. Venous sinuses: Patent Anatomic variants: None significant Review of the MIP images confirms the above findings IMPRESSION: 1. No emergent finding.  Atherosclerosis is mild and there is no flow limiting stenosis of major vessels. 2. Suboccipital decompression. 3. 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 4. 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 Electronically Signed   By: Marnee SpringJonathon  Watts M.D.   On: 03/14/2019 09:50   Ct Head Wo Contrast  Result Date: 03/14/2019 CLINICAL DATA:  Transient right upper extremity weakness EXAM: CT HEAD WITHOUT CONTRAST TECHNIQUE: Contiguous axial images were obtained from the base of the skull through the vertex without intravenous contrast. COMPARISON:  None. FINDINGS: Brain: There are postsurgical changes from prior left suboccipital craniectomy with gliosis in the left cerebellar hemisphere and expected dural thickening of the posterior fossa. Patchy areas of white matter hypoattenuation are most compatible with chronic microvascular angiopathy. No evidence of acute infarction, hemorrhage, frank hydrocephalus or extra-axial collection. Vascular: Atherosclerotic calcification of the carotid siphons. No hyperdense vessel. Skull: Prior suboccipital craniectomy and partial resection of the posterior arch of C1. No acute osseous abnormality. No scalp swelling or hematoma. Few benign scalp calcifications are present. Sinuses/Orbits: Paranasal sinuses and mastoid air cells are predominantly clear. Included orbital structures are unremarkable. Other: None IMPRESSION: 1. No CT evidence of large territory infarct or other acute intracranial abnormality. If there is persisting clinical concern for ischemia, consider MRI evaluation. 2. Postsurgical changes from prior left suboccipital craniectomy and partial resection of the posterior arch of C1. Gliosis of the left cerebellar hemisphere could reflect postsurgical changes from prior resection or infarct. Correlate with patient history. Electronically Signed   By: Kreg ShropshirePrice  DeHay M.D.   On: 03/14/2019 03:42   Ct Angio Neck W Or Wo  Contrast  Result Date: 03/14/2019 CLINICAL DATA:  Stroke workup EXAM: CT ANGIOGRAPHY HEAD AND NECK TECHNIQUE: Multidetector CT imaging of the head and neck was performed using the standard protocol during bolus administration of intravenous contrast. Multiplanar CT image reconstructions and MIPs were obtained to evaluate the vascular anatomy. Carotid stenosis measurements (when applicable) are obtained utilizing NASCET criteria, using the distal internal carotid diameter as the denominator. CONTRAST:  50mL OMNIPAQUE IOHEXOL 350 MG/ML SOLN COMPARISON:  Head CT and brain MRI earlier today FINDINGS: CTA NECK FINDINGS Aortic arch: Atherosclerotic calcification.  Three vessel branching. Right carotid system: Vessels are smooth and widely patent. No atheromatous changes noted. Left carotid system: Vessels are smooth and widely patent. No noted atheromatous changes. Vertebral arteries: No proximal subclavian stenosis or atherosclerosis. Tiny calcified plaque seen at the left vertebral origin. Both vertebral arteries are smooth and widely patent to the dura. Skeleton: Suboccipital craniectomy and C1 posterior ring resection presumably for Chiari malformation as noted on prior brain MRI. Disc narrowing and endplate ridging with a central disc protrusion at C4-5 with calcified annulus, presumably flattening the cord. Other neck: Medial ization of the left focal fold compared to the right. Upper chest: Negative Review of the MIP images confirms the above findings CTA HEAD FINDINGS Anterior circulation: Atherosclerotic plaque on the carotid siphons. No branch occlusion, aneurysm, or flow limiting stenosis. Posterior circulation: Symmetric vertebral arteries. No branch occlusion, beading, or  aneurysm. No flow limiting stenosis. Possible mild atheromatous irregularity of posterior cerebral arteries. Venous sinuses: Patent Anatomic variants: None significant Review of the MIP images confirms the above findings IMPRESSION: 1. No  emergent finding. Atherosclerosis is mild and there is no flow limiting stenosis of major vessels. 2. Suboccipital decompression. 3. 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 4. 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 Electronically Signed   By: Marnee Spring M.D.   On: 03/14/2019 09:50   Mr Brain Wo Contrast  Result Date: 03/14/2019 CLINICAL DATA:  Right upper extremity weakness that is now resolved, evaluate for TIA. EXAM: MRI HEAD WITHOUT CONTRAST TECHNIQUE: Multiplanar, multiecho pulse sequences of the brain and surrounding structures were obtained without intravenous contrast. COMPARISON:  Head CT from earlier today FINDINGS: Brain: Subcentimeter acute infarcts along the inferior right temporal cortex and along the precentral gyrus superiorly. No acute hemorrhage, hydrocephalus, or masslike finding. Ischemic and/or postoperative encephalomalacia in the bilateral inferior cerebellum after suboccipital decompression. 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 No continued foramen magnum stenosis. Mild small vessel ischemic gliosis in the cerebral white matter. Vascular: Normal flow voids Skull and upper cervical spine: Suboccipital craniectomy Sinuses/Orbits: Negative IMPRESSION: 1. 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 2. 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 There is encephalomalacia in the left more than right inferior cerebellum. 3. Mild chronic small vessel ischemia in the cerebral white matter. Electronically Signed   By: Marnee Spring M.D.   On: 03/14/2019 06:56    PHYSICAL EXAM Obese elderly African-American lady not in distress. . Afebrile. Head is nontraumatic. Neck is supple without bruit.    Cardiac exam no murmur or gallop. Lungs are clear to auscultation. Distal pulses are  well felt. Neurological Exam ;  Awake  Alert oriented x 3. Normal speech and language.eye movements full without nystagmus.fundi were not visualized. Vision acuity and fields appear normal. Hearing is normal. Palatal movements are normal. Face symmetric. Tongue midline. Normal strength, tone, reflexes and coordination. diminished fine finger movements on the right.  Orbits left over right upper extremity.  Slight weakness of right grip only.  Normal sensation. Gait deferred.  ASSESSMENT/PLAN Ms. Martha Kelly is a 73 y.o. female with history of prediabetes and HTN presenting with transient R hand numbness and weakness.   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 Postsurgical changes from prior L suboccipital crani and partial resection posterior arch C1. L cerebellar gliosis.   MRI  Small L precentral gyrus and inferior R temporal cortex infarcts. Suboccipital crani w/ L>R inferior cerebellar encephalomalacia. Mild small vessel disease.   CTA head & neck no ELVO. Mild atherosclerosis. Sub occipital crani. Cervical spine degeneration w/ chronic C4-5 disc protrusion. Suspect L vocal cord paresis.   2D Echo EF 60-65%. No source of embolus   TEE small PFO, ASA, positive bubble  LE dopplers negative for DVT   consider loop placement to look for AF as source of stroke  LDL 109  HgbA1c 6.4  Lovenox 40 mg sq daily for VTE prophylaxis  No antithrombotic prior to admission, now on aspirin 325 mg daily. Given mild stroke, recommend aspirin 81 mg and plavix 75 mg daily x 3 weeks, then aspirin alone. Orders adjusted.   Therapy recommendations:  OP  PT, OP SLP  Disposition:  pending   Hypertension  Stable . Permissive hypertension (OK if < 220/120) but gradually normalize in 5-7 days . Long-term BP goal normotensive  Hyperlipidemia  Home meds:  Livalo, resumed in hospital  as pravachol 40 (formulary adjustment)  LDL 109, goal < 70  Continue statin at discharge  Diabetes type II Controlled  HgbA1c 6.4, goal < 7.0  Other Stroke Risk Factors  Advanced age  Morbid Obesity, Body mass index is 42.19 kg/m., recommend weight loss, diet and exercise as appropriate   Other Active Problems    Hospital day # 1  I have personally obtained history,examined this patient, reviewed notes, independently viewed imaging studies, participated in medical decision making and plan of care.ROS completed by me personally and pertinent positives fully documented  I have made any additions or clarifications directly to the above note.  She presented with sudden onset of right hand weakness and paresthesia secondary to embolic left frontal tiny MCA branch infarct.  Imaging and cardiac evaluation is unremarkable except for a PFO but patient has a low rope score given her age and vascular risk factors and is recommended conservative medical follow-up and aspirin and Plavix for 3 weeks followed by aspirin alone and aggressive risk factor modification.  May consider possible participation in the [place] trial for cryptogenic stroke in the future if interested patient will be discharged later today after loop recorder insertion.  Long discussion with patient as well as with her daughter via face time and answered questions.  Greater than 50% time during this 25-minute visit was spent on counseling and coordination of care about her cryptogenic stroke and answering questions.  Follow-up as an outpatient stroke clinic in 6 weeks.  Delia Heady, MD  Delia Heady, MD Medical Director [facility] Pager: [phone] 03/15/2019 3:47 PM   To contact Stroke Continuity provider, please refer to [email or web address]. After hours, contact General Neurology

## 2019-03-15 NOTE — Progress Notes (Signed)
    [facility] has been requested to perform a transesophageal echocardiogram on Martha Kelly for stroke.  After careful review of history and examination, the risks and benefits of transesophageal echocardiogram have been explained including risks of esophageal damage, perforation (1:10,000 risk), bleeding, pharyngeal hematoma as well as other potential complications associated with conscious sedation including aspiration, arrhythmia, respiratory failure and death. Alternatives to treatment were discussed, questions were answered. Patient is willing to proceed.  TEE - Dr. Pennie Banter today  @ 1230 . Keep NPO.  Meds with sips.   Martha Kail, PA-C 03/15/2019 8:04 AM

## 2019-03-15 NOTE — Discharge Instructions (Signed)
Heart monitor implant site care instructions °Keep incision clean and dry for 3 days. °You can remove outer dressing tomorrow. °Leave steri-strips (little pieces of tape) on until seen in the office for wound check appointment. °Call the office ([phone]) for redness, drainage, swelling, or fever. ° °

## 2019-03-15 NOTE — Evaluation (Signed)
Occupational Therapy Evaluation Patient Details Name: Martha Kelly MRN: [id number] DOB: 1945/07/27 Today's Date: 03/15/2019    History of Present Illness Pt is a 73 y/o female admitted secondary to R hand weakness and numbness. Found to have small acute infarcts in L precentral gyrus and inferior R temporal cortex. PMH includes prediabetes and HTN.    Clinical Impression   Pt PTA: living with daughter as pt is visiting from [place]. Pt reports that she feels as though she is back to baseline, but requiring SPC for safe mobility.  Pt standing at sink for ADL with no LOB episodes. Pt performing ADL tasks with no difficulty with R FMC to open containers and feed self. Pt requires no OT follow-up at this time. OT signing off.    Follow Up Recommendations  No OT follow up    Equipment Recommendations  None recommended by OT    Recommendations for Other Services       Precautions / Restrictions Precautions Precautions: None Restrictions Weight Bearing Restrictions: No      Mobility Bed Mobility               General bed mobility comments: At sink upon entry.  Transfers Overall transfer level: Needs assistance Equipment used: None;Straight cane Transfers: Sit to/from Stand Sit to Stand: Supervision         General transfer comment: Pt performing transfers with supervisionA, but no physical assist needed.    Balance Overall balance assessment: Needs assistance Sitting-balance support: No upper extremity supported;Feet supported Sitting balance-Leahy Scale: Good     Standing balance support: No upper extremity supported;Single extremity supported;During functional activity Standing balance-Leahy Scale: Fair                             ADL either performed or assessed with clinical judgement   ADL Overall ADL's : At baseline                                       General ADL Comments: Pt standing at sink for ADL with no  mobility LOB episodes. Pt performing ADL tasks with no difficulty with FMC to open containers and feed self.     Vision Baseline Vision/History: No visual deficits Vision Assessment?: No apparent visual deficits     Perception     Praxis      Pertinent Vitals/Pain Pain Assessment: No/denies pain Pain Score: 0-No pain Pain Intervention(s): Monitored during session     Hand Dominance Right   Extremity/Trunk Assessment Upper Extremity Assessment Upper Extremity Assessment: Generalized weakness;RUE deficits/detail RUE Deficits / Details: R hand FMC increasing function per pt.   Lower Extremity Assessment Lower Extremity Assessment: Generalized weakness;Defer to PT evaluation   Cervical / Trunk Assessment Cervical / Trunk Assessment: Normal   Communication Communication Communication: No difficulties   Cognition Arousal/Alertness: Awake/alert Behavior During Therapy: WFL for tasks assessed/performed Overall Cognitive Status: No family/caregiver present to determine baseline cognitive functioning                                     General Comments  Pt reports that she feels as though she is back to baseline, but requiring SPC for safe mobility.    Exercises     Shoulder Instructions      Home  Living Family/patient expects to be discharged to:: Private residence Living Arrangements: Children Available Help at Discharge: Family;Available 24 hours/day Type of Home: House Home Access: Level entry     Home Layout: Two level;Able to live on main level with bedroom/bathroom     Bathroom Shower/Tub: Occupational psychologist: Handicapped height     Home Equipment: None   Additional Comments: She is currently staying with her daughter. Reports in a couple weeks, she plans to go back to her home in [place], [place].   Lives With: Daughter    Prior Functioning/Environment Level of Independence: Independent                 OT Problem  List:        OT Treatment/Interventions:      OT Goals(Current goals can be found in the care plan section) Acute Rehab OT Goals Patient Stated Goal: to go home OT Goal Formulation: With patient  OT Frequency:     Barriers to D/C:            Co-evaluation              AM-PAC OT "6 Clicks" Daily Activity     Outcome Measure Help from another person eating meals?: None Help from another person taking care of personal grooming?: None Help from another person toileting, which includes using toliet, bedpan, or urinal?: None Help from another person bathing (including washing, rinsing, drying)?: A Little Help from another person to put on and taking off regular upper body clothing?: None Help from another person to put on and taking off regular lower body clothing?: None 6 Click Score: 23   End of Session Equipment Utilized During Treatment: Gait belt;Rolling walker Nurse Communication: Mobility status  Activity Tolerance: Patient tolerated treatment well Patient left: in bed;with call bell/phone within reach  OT Visit Diagnosis: Muscle weakness (generalized) (M62.81)                Time: 3254-9826 OT Time Calculation (min): 17 min Charges:  OT General Charges $OT Visit: 1 Visit OT Evaluation $OT Eval Moderate Complexity: 1 Mod  Darryl Nestle) Marsa Aris OTR/L Acute Rehabilitation Services Pager: [phone] Office: [place] 03/15/2019, 4:20 PM

## 2019-03-15 NOTE — Discharge Summary (Addendum)
Physician Discharge Summary  Martha Kelly ZOX:[id number] DOB: 09-Nov-1945 DOA: 03/13/2019  PCP: Frankey Poot, MD  Admit date: 03/13/2019 Discharge date: 03/15/2019  Admitted From: Home Disposition: Home   Recommendations for Outpatient Follow-up:  1. Follow up with PCP in 2-3 weeks 2. Patient being discharged on aspirin 81 mg and Plavix 75 mg daily x3 weeks then aspirin alone. 3. Follow-up with neurology in 6 weeks 4. HA1C 6.4, prediabetic.  Encourage stroke risk factor modification 5. 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  Home Health: no Equipment/Devices: Cane  Discharge Condition: Stable CODE STATUS: Full Diet recommendation: Carb control  Brief/Interim Summary: This is a pleasant 73 year old female with past medical history of prediabetes, hypertension who presented to the ED on 03/14/2019 after having numbness in her right hand the day prior while eating breakfast.  Patient stated she went to grab a fork while eating breakfast but also had weak grip at the same time.  She reported she had been fasting for the past 1 to 2 weeks prior and thought her symptoms were related to this but did not have improvement and subsequently went to the ED.  Her symptoms had completely resolved while hospitalized.  She underwent CT head without evidence of large territory infarct.  She also underwent MRI brain without contrast and was found to have small acute infarcts in the left precentral gyrus and inferior right temporal cortex suggesting embolic disease along with chronic small vessel ischemia.  Neurology was consulted.  She was started on full dose aspirin, statin and allowed permissive hypertension with telemetry, frequent neuro checks and echo was ordered.  Patient's echo could not exclude ASD/PFO and she subsequently underwent TEE.  TEE showed small PFO.  Patient underwent lower extremity venous ultrasound which did not show evidence of  VTE's bilaterally.  Patient had successful insertion of loop recorder prior to discharge to evaluate for atrial fibrillation as cause of her cryptogenic stroke.  Patient discharged with aspirin 81 mg and Plavix 75 mg x 3 weeks.  Plan for aspirin monotherapy post 3 weeks.  Livalo switched to atorvastatin 40 mg daily which patient agreed to.  Patient needs to make an appointment with neurology for follow-up in 6 weeks  Patient to go home with home health  Discharge Diagnoses:  Principal Problem:   Cryptogenic stroke [facility]) Active Problems:   Essential hypertension   HLD (hyperlipidemia)   PFO (patent foramen ovale)    Discharge Instructions  Discharge Instructions    Ambulatory referral to Neurology   Complete by: As directed    An appointment is requested in approximately: 6 weeks with Dr. Pearlean Brownie   Diet - low sodium heart healthy   Complete by: As directed    Discharge instructions   Complete by: As directed    You came to the hospital with numbness in your right hand and you were found to have a stroke.  You were evaluated by cardiology and you had a loop recorder implanted on your skin to monitor for any irregular heart rhythm which may have caused your stroke.  Medication adjustments: Start taking aspirin 81 mg and plavix 75 mg daily x 3 weeks, then aspirin alone. Stop taking Livalo and start taking Lipitor 40 mg daily Follow-up with your primary care physician in the next 2 to 3 weeks Follow-up with neurology in 6 weeks with Dr. Pearlean Brownie Stop taking prednisone  You were found to have prediabetes.  This is a risk factor for stroke.  Make sure you stick to a low carbohydrate diet while at home and discuss this with your primary care physician.   Increase activity slowly   Complete by: As directed      Allergies as of 03/15/2019   No Known Allergies     Medication List    STOP taking these medications   Livalo 2 MG Tabs Generic drug: Pitavastatin Calcium   predniSONE 20 MG  tablet Commonly known as: DELTASONE     TAKE these medications   aspirin 81 MG EC tablet Take 1 tablet (81 mg total) by mouth daily. Start taking on: March 16, 2019   atorvastatin 40 MG tablet Commonly known as: Lipitor Take 1 tablet (40 mg total) by mouth daily.   clopidogrel 75 MG tablet Commonly known as: PLAVIX Take 1 tablet (75 mg total) by mouth daily for 21 days.   hydrochlorothiazide 25 MG tablet Commonly known as: HYDRODIURIL Take 25 mg by mouth daily.   lisinopril 20 MG tablet Commonly known as: ZESTRIL Take 20 mg by mouth daily.   metFORMIN 500 MG tablet Commonly known as: GLUCOPHAGE Take 500 mg by mouth 2 (two) times daily.   Vitamin D 50 MCG (2000 UT) tablet Take 2,000 Units by mouth daily.            Durable Medical Equipment  (From admission, onward)         Start     Ordered   03/14/19 1230  For home use only DME Cane  Once     03/14/19 1229         Follow-up Information    [facility] Follow up.   Specialty: Cardiology Why: 03/26/2019 @ 4:30PM, wound check visit.  This is set up as a virtual visit.  The nurse will call you just ahead of your appointment to set up face time visit with you Contact information: [address] [place] [address] [phone]         No Known Allergies  Consultations: Neurology, cardiology  Procedures/Studies: Ct Angio Head W Or Wo Contrast  Result Date: 03/14/2019 CLINICAL DATA:  Stroke workup EXAM: CT ANGIOGRAPHY HEAD AND NECK TECHNIQUE: Multidetector CT imaging of the head and neck was performed using the standard protocol during bolus administration of intravenous contrast. Multiplanar CT image reconstructions and MIPs were obtained to evaluate the vascular anatomy. Carotid stenosis measurements (when applicable) are obtained utilizing NASCET criteria, using the distal internal carotid diameter as the denominator. CONTRAST:  94mL OMNIPAQUE IOHEXOL  350 MG/ML SOLN COMPARISON:  Head CT and brain MRI earlier today FINDINGS: CTA NECK FINDINGS Aortic arch: Atherosclerotic calcification.  Three vessel branching. Right carotid system: Vessels are smooth and widely patent. No atheromatous changes noted. Left carotid system: Vessels are smooth and widely patent. No noted atheromatous changes. Vertebral arteries: No proximal subclavian stenosis or atherosclerosis. Tiny calcified plaque seen at the left vertebral origin. Both vertebral arteries are smooth and widely patent to the dura. Skeleton: Suboccipital craniectomy and C1 posterior ring resection presumably for Chiari malformation as noted on prior brain MRI. Disc narrowing and endplate ridging with a central disc protrusion at C4-5 with calcified annulus, presumably flattening the cord. Other neck: Medial ization of the left focal fold compared to the right. Upper chest: Negative Review of the MIP images confirms the above findings CTA HEAD FINDINGS Anterior circulation: Atherosclerotic plaque on the carotid siphons. No branch occlusion, aneurysm, or flow limiting stenosis. Posterior circulation: Symmetric vertebral arteries.  No branch occlusion, beading, or aneurysm. No flow limiting stenosis. Possible mild atheromatous irregularity of posterior cerebral arteries. Venous sinuses: Patent Anatomic variants: None significant Review of the MIP images confirms the above findings IMPRESSION: 1. No emergent finding. Atherosclerosis is mild and there is no flow limiting stenosis of major vessels. 2. Suboccipital decompression. 3. 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 4. 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 Electronically Signed   By: Monte Fantasia M.D.   On: 03/14/2019 09:50   Ct Head Wo Contrast  Result Date: 03/14/2019 CLINICAL DATA:  Transient right upper extremity weakness EXAM: CT HEAD WITHOUT CONTRAST TECHNIQUE: Contiguous axial images were obtained  from the base of the skull through the vertex without intravenous contrast. COMPARISON:  None. FINDINGS: Brain: There are postsurgical changes from prior left suboccipital craniectomy with gliosis in the left cerebellar hemisphere and expected dural thickening of the posterior fossa. Patchy areas of white matter hypoattenuation are most compatible with chronic microvascular angiopathy. No evidence of acute infarction, hemorrhage, frank hydrocephalus or extra-axial collection. Vascular: Atherosclerotic calcification of the carotid siphons. No hyperdense vessel. Skull: Prior suboccipital craniectomy and partial resection of the posterior arch of C1. No acute osseous abnormality. No scalp swelling or hematoma. Few benign scalp calcifications are present. Sinuses/Orbits: Paranasal sinuses and mastoid air cells are predominantly clear. Included orbital structures are unremarkable. Other: None IMPRESSION: 1. No CT evidence of large territory infarct or other acute intracranial abnormality. If there is persisting clinical concern for ischemia, consider MRI evaluation. 2. Postsurgical changes from prior left suboccipital craniectomy and partial resection of the posterior arch of C1. Gliosis of the left cerebellar hemisphere could reflect postsurgical changes from prior resection or infarct. Correlate with patient history. Electronically Signed   By: Lovena Le M.D.   On: 03/14/2019 03:42   Ct Angio Neck W Or Wo Contrast  Result Date: 03/14/2019 CLINICAL DATA:  Stroke workup EXAM: CT ANGIOGRAPHY HEAD AND NECK TECHNIQUE: Multidetector CT imaging of the head and neck was performed using the standard protocol during bolus administration of intravenous contrast. Multiplanar CT image reconstructions and MIPs were obtained to evaluate the vascular anatomy. Carotid stenosis measurements (when applicable) are obtained utilizing NASCET criteria, using the distal internal carotid diameter as the denominator. CONTRAST:  85mL  OMNIPAQUE IOHEXOL 350 MG/ML SOLN COMPARISON:  Head CT and brain MRI earlier today FINDINGS: CTA NECK FINDINGS Aortic arch: Atherosclerotic calcification.  Three vessel branching. Right carotid system: Vessels are smooth and widely patent. No atheromatous changes noted. Left carotid system: Vessels are smooth and widely patent. No noted atheromatous changes. Vertebral arteries: No proximal subclavian stenosis or atherosclerosis. Tiny calcified plaque seen at the left vertebral origin. Both vertebral arteries are smooth and widely patent to the dura. Skeleton: Suboccipital craniectomy and C1 posterior ring resection presumably for Chiari malformation as noted on prior brain MRI. Disc narrowing and endplate ridging with a central disc protrusion at C4-5 with calcified annulus, presumably flattening the cord. Other neck: Medial ization of the left focal fold compared to the right. Upper chest: Negative Review of the MIP images confirms the above findings CTA HEAD FINDINGS Anterior circulation: Atherosclerotic plaque on the carotid siphons. No branch occlusion, aneurysm, or flow limiting stenosis. Posterior circulation: Symmetric vertebral arteries. No branch occlusion, beading, or aneurysm. No flow limiting stenosis. Possible mild atheromatous irregularity of posterior cerebral arteries. Venous sinuses: Patent Anatomic variants: None significant Review of the MIP images confirms the above findings IMPRESSION: 1. No emergent finding.  Atherosclerosis is mild and there is no flow limiting stenosis of major vessels. 2. Suboccipital decompression. 3. 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 4. 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 Electronically Signed   By: Marnee Spring M.D.   On: 03/14/2019 09:50   Mr Brain Wo Contrast  Result Date: 03/14/2019 CLINICAL DATA:  Right upper extremity weakness that is now resolved, evaluate for TIA. EXAM: MRI HEAD WITHOUT CONTRAST  TECHNIQUE: Multiplanar, multiecho pulse sequences of the brain and surrounding structures were obtained without intravenous contrast. COMPARISON:  Head CT from earlier today FINDINGS: Brain: Subcentimeter acute infarcts along the inferior right temporal cortex and along the precentral gyrus superiorly. No acute hemorrhage, hydrocephalus, or masslike finding. Ischemic and/or postoperative encephalomalacia in the bilateral inferior cerebellum after suboccipital decompression. 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 No continued foramen magnum stenosis. Mild small vessel ischemic gliosis in the cerebral white matter. Vascular: Normal flow voids Skull and upper cervical spine: Suboccipital craniectomy Sinuses/Orbits: Negative IMPRESSION: 1. 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 2. 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 There is encephalomalacia in the left more than right inferior cerebellum. 3. Mild chronic small vessel ischemia in the cerebral white matter. Electronically Signed   By: Marnee Spring M.D.   On: 03/14/2019 06:56   Vas [place] Lower Extremity Venous (dvt)  Result Date: 03/15/2019  Lower Venous Study Indications: Stroke, and + PFO.  Comparison Study: no prior Performing Technologist: Jeb Levering RDMS, RVT  Examination Guidelines: A complete evaluation includes B-mode imaging, spectral Doppler, color Doppler, and power Doppler as needed of all accessible portions of each vessel. Bilateral testing is considered an integral part of a complete examination. Limited examinations for reoccurring indications may be performed as noted.  +---------+---------------+---------+-----------+----------+--------------+ RIGHT    CompressibilityPhasicitySpontaneityPropertiesThrombus Aging  +---------+---------------+---------+-----------+----------+--------------+ CFV      Full           Yes      Yes                                 +---------+---------------+---------+-----------+----------+--------------+ SFJ      Full                                                        +---------+---------------+---------+-----------+----------+--------------+ FV Prox  Full                                                        +---------+---------------+---------+-----------+----------+--------------+ FV Mid   Full                                                        +---------+---------------+---------+-----------+----------+--------------+ FV DistalFull                                                        +---------+---------------+---------+-----------+----------+--------------+  PFV      Full                                                        +---------+---------------+---------+-----------+----------+--------------+ POP      Full           Yes      Yes                                 +---------+---------------+---------+-----------+----------+--------------+ PTV      Full                                                        +---------+---------------+---------+-----------+----------+--------------+ PERO     Full                                                        +---------+---------------+---------+-----------+----------+--------------+   +---------+---------------+---------+-----------+----------+--------------+ LEFT     CompressibilityPhasicitySpontaneityPropertiesThrombus Aging +---------+---------------+---------+-----------+----------+--------------+ CFV      Full           Yes      Yes                                 +---------+---------------+---------+-----------+----------+--------------+ SFJ      Full                                                         +---------+---------------+---------+-----------+----------+--------------+ FV Prox  Full                                                        +---------+---------------+---------+-----------+----------+--------------+ FV Mid   Full                                                        +---------+---------------+---------+-----------+----------+--------------+ FV DistalFull                                                        +---------+---------------+---------+-----------+----------+--------------+ PFV      Full                                                        +---------+---------------+---------+-----------+----------+--------------+  POP      Full           Yes      Yes                                 +---------+---------------+---------+-----------+----------+--------------+ PTV      Full                                                        +---------+---------------+---------+-----------+----------+--------------+ PERO     Full                                                        +---------+---------------+---------+-----------+----------+--------------+     Summary: Right: There is no evidence of deep vein thrombosis in the lower extremity. No cystic structure found in the popliteal fossa. Left: There is no evidence of deep vein thrombosis in the lower extremity. No cystic structure found in the popliteal fossa.  *See table(s) above for measurements and observations. Electronically signed by Sherald Hess MD on 03/15/2019 at 4:44:52 PM.    Final       Subjective:   Discharge Exam: Vitals:   03/15/19 1151 03/15/19 1318  BP: 116/81 (!) 136/120  Pulse: 76 94  Resp: 18 20  Temp: 98 F (36.7 C) 97.6 F (36.4 C)  SpO2: 100% 99%   Vitals:   03/15/19 1123 03/15/19 1135 03/15/19 1151 03/15/19 1318  BP: 131/62 122/68 116/81 (!) 136/120  Pulse:  69 76 94  Resp: Temp:   98 F (36.7 C) 97.6 F (36.4 C)  TempSrc:    Oral Oral  SpO2: 98% 97% 100% 99%  Weight:      Height:        Physical Exam Vitals signs and nursing note reviewed.  Constitutional:      Appearance: Normal appearance. She is obese.  HENT:     Head: Normocephalic and atraumatic.     Nose: Nose normal.     Mouth/Throat:     Mouth: Mucous membranes are moist.  Eyes:     Extraocular Movements: Extraocular movements intact.     Conjunctiva/sclera: Conjunctivae normal.  Neck:     Musculoskeletal: Normal range of motion. No neck rigidity.  Cardiovascular:     Rate and Rhythm: Normal rate and regular rhythm.  Pulmonary:     Effort: Pulmonary effort is normal.     Breath sounds: Normal breath sounds.  Musculoskeletal: Normal range of motion.        General: No swelling.  Neurological:     General: No focal deficit present.     Mental Status: She is alert. Mental status is at baseline.  Psychiatric:        Mood and Affect: Mood normal.        Behavior: Behavior normal.        The results of significant diagnostics from this hospitalization (including imaging, microbiology, ancillary and laboratory) are listed below for reference.     Microbiology: Recent Results (from the past 240 hour(s))  SARS CORONAVIRUS 2 (TAT 6-24 HRS) Nasopharyngeal Nasopharyngeal Swab  Status: None   Collection Time: 03/14/19  4:07 AM   Specimen: Nasopharyngeal Swab  Result Value Ref Range Status   SARS Coronavirus 2 NEGATIVE NEGATIVE Final    Comment: (NOTE) SARS-CoV-2 target nucleic acids are NOT DETECTED. The SARS-CoV-2 RNA is generally detectable in upper and lower respiratory specimens during the acute phase of infection. Negative results do not preclude SARS-CoV-2 infection, do not rule out co-infections with other pathogens, and should not be used as the sole basis for treatment or other patient management decisions. Negative results must be combined with clinical observations, patient history, and epidemiological information. The  expected result is Negative. Fact Sheet for Patients: HairSlick.no Fact Sheet for Healthcare Providers: quierodirigir.com This test is not yet approved or cleared by the [place] FDA and  has been authorized for detection and/or diagnosis of SARS-CoV-2 by FDA under an Emergency Use Authorization (EUA). This EUA will remain  in effect (meaning this test can be used) for the duration of the COVID-19 declaration under Section 56 4(b)(1) of the Act, 21 U.S.C. section 360bbb-3(b)(1), unless the authorization is terminated or revoked sooner. Performed at [facility], [address], [place], [place] [address]      Labs: BNP (last 3 results) No results for input(s): BNP in the last 8760 hours. Basic Metabolic Panel: Recent Labs  Lab 03/13/19 1904 03/13/19 1916 03/14/19 0749  NA 138 140 140  K 4.0 3.9 4.0  CL 100 102 101  CO2 26  --  28  GLUCOSE 133* 127* 111*  BUN 22 25* 20  CREATININE 1.24* 1.10* 1.09*  CALCIUM 9.2  --  9.1   Liver Function Tests: Recent Labs  Lab 03/13/19 1904 03/14/19 0749  AST 22 17  ALT 21 19  ALKPHOS 40 34*  BILITOT 0.6 0.8  PROT 6.8 6.5  ALBUMIN 3.6 3.6   No results for input(s): LIPASE, AMYLASE in the last 168 hours. No results for input(s): AMMONIA in the last 168 hours. CBC: Recent Labs  Lab 03/13/19 1904 03/13/19 1916 03/14/19 0749  WBC 10.2  --  7.4  NEUTROABS 5.0  --   --   HGB 14.4 15.3* 14.2  HCT 44.1 45.0 43.4  MCV 90.4  --  89.3  PLT 264  --  258   Cardiac Enzymes: No results for input(s): CKTOTAL, CKMB, CKMBINDEX, TROPONINI in the last 168 hours. BNP: Invalid input(s): POCBNP CBG: Recent Labs  Lab 03/14/19 1703 03/14/19 2051 03/15/19 0628 03/15/19 1030 03/15/19 1315  GLUCAP 101* 111* 115* 108* 91   D-Dimer No results for input(s): DDIMER in the last 72 hours. Hgb A1c Recent Labs    03/14/19 0749  HGBA1C 6.4*   Lipid Profile Recent Labs     03/14/19 0749  CHOL 173  HDL 45  LDLCALC 109*  TRIG 96  CHOLHDL 3.8   Thyroid function studies No results for input(s): TSH, T4TOTAL, T3FREE, THYROIDAB in the last 72 hours.  Invalid input(s): FREET3 Anemia work up No results for input(s): VITAMINB12, FOLATE, FERRITIN, TIBC, IRON, RETICCTPCT in the last 72 hours. Urinalysis No results found for: COLORURINE, APPEARANCEUR, LABSPEC, PHURINE, GLUCOSEU, HGBUR, BILIRUBINUR, KETONESUR, PROTEINUR, UROBILINOGEN, NITRITE, LEUKOCYTESUR Sepsis Labs Invalid input(s): PROCALCITONIN,  WBC,  LACTICIDVEN Microbiology Recent Results (from the past 240 hour(s))  SARS CORONAVIRUS 2 (TAT 6-24 HRS) Nasopharyngeal Nasopharyngeal Swab     Status: None   Collection Time: 03/14/19  4:07 AM   Specimen: Nasopharyngeal Swab  Result Value Ref Range Status   SARS Coronavirus  2 NEGATIVE NEGATIVE Final    Comment: (NOTE) SARS-CoV-2 target nucleic acids are NOT DETECTED. The SARS-CoV-2 RNA is generally detectable in upper and lower respiratory specimens during the acute phase of infection. Negative results do not preclude SARS-CoV-2 infection, do not rule out co-infections with other pathogens, and should not be used as the sole basis for treatment or other patient management decisions. Negative results must be combined with clinical observations, patient history, and epidemiological information. The expected result is Negative. Fact Sheet for Patients: HairSlick.nohttps://www.fda.gov/media/138098/download Fact Sheet for Healthcare Providers: quierodirigir.comhttps://www.fda.gov/media/138095/download This test is not yet approved or cleared by the [place] FDA and  has been authorized for detection and/or diagnosis of SARS-CoV-2 by FDA under an Emergency Use Authorization (EUA). This EUA will remain  in effect (meaning this test can be used) for the duration of the COVID-19 declaration under Section 56 4(b)(1) of the Act, 21 U.S.C. section 360bbb-3(b)(1), unless the  authorization is terminated or revoked sooner. Performed at [facility], [address] [place], [place] [id number]      Time coordinating discharge: Over 30 minutes  SIGNED:   Jae DireJared E Jeylin Woodmansee, D.O. [facility] 03/15/2019, 5:48 PM

## 2019-03-15 NOTE — Consult Note (Addendum)
ELECTROPHYSIOLOGY CONSULT NOTE  Patient ID: Martha Kelly MRN: [id number], DOB/AGE: 71/01/1946   Admit date: 03/13/2019 Date of Consult: 03/15/2019  Primary Physician: Frankey PootSurles, Lara Kester, MD Primary Cardiologist: none Reason for Consultation: Cryptogenic stroke - ; recommendations regarding Implantable Loop Recorder, requested by Dr. Pearlean BrownieSethi  History of Present Illness Martha Kelly was admitted on 03/13/2019 with R hand weakness/numbness found with stroke.  She developed symptoms while eating breakfast.   PMHx noted for HTNm pre-diabetes, and obesity Neurology has requested evaluation for loop recorder she has undergone workup for stroke including echocardiogram and carotid angio.  The patient has been monitored on telemetry which has demonstrated sinus rhythm with no arrhythmias.     TEE today IMPRESSIONS 1. Left ventricular ejection fraction, by visual estimation, is 65 to 70%. The left ventricle has hyperdynamic function.  2. Global right ventricle has normal systolic function.The right ventricular size is normal.  3. Left atrial size was normal.  4. Right atrial size was normal.  5. The mitral valve is normal in structure. Mild mitral valve regurgitation.  6. The tricuspid valve is normal in structure. Tricuspid valve regurgitation is mild.  7. The aortic valve is tricuspid Aortic valve regurgitation was not visualized by color flow Doppler.  8. The pulmonic valve was normal in structure. Pulmonic valve regurgitation is trivial by color flow Doppler.  9. Mild plaque invoving the descending aorta. 10. Small patent foramen ovale. 11. Normal LV function; mild MR and TR; atrial septal aneurysm; positive saline microcavitation study c/w PFO.  FINDINGS  Left Ventricle: Left ventricular ejection fraction, by visual estimation, is 65 to 70%. The left ventricle has hyperdynamic function.    VENOUS dopplers are ordered/pending    Lab work is reviewed.    Prior to  admission, the patient denies chest pain, shortness of breath, dizziness, palpitations, or syncope.  They are recovering from their stroke with plans to home at discharge.     Past Medical History:  Diagnosis Date  . Hypertension   . Prediabetes      Surgical History:  Past Surgical History:  Procedure Laterality Date  . ABDOMINAL HYSTERECTOMY    . NECK SURGERY       Medications Prior to Admission  Medication Sig Dispense Refill Last Dose  . Cholecalciferol (VITAMIN D) 50 MCG (2000 UT) tablet Take 2,000 Units by mouth daily.   03/13/2019 at Unknown time  . hydrochlorothiazide (HYDRODIURIL) 25 MG tablet Take 25 mg by mouth daily.   03/13/2019 at Unknown time  . lisinopril (ZESTRIL) 20 MG tablet Take 20 mg by mouth daily.   03/13/2019 at Unknown time  . LIVALO 2 MG TABS Take 2 mg by mouth at bedtime.   03/12/2019 at Unknown time  . metFORMIN (GLUCOPHAGE) 500 MG tablet Take 500 mg by mouth 2 (two) times daily.   03/13/2019 at Unknown time  . predniSONE (DELTASONE) 20 MG tablet Take 10 mg by mouth daily.   03/13/2019 at Unknown time    Inpatient Medications:  .  stroke: mapping our early stages of recovery book   Does not apply Once  . aspirin  300 mg Rectal Daily   Or  . aspirin  325 mg Oral Daily  . cholecalciferol  2,000 Units Oral Daily  . enoxaparin (LOVENOX) injection  40 mg Subcutaneous Q24H  . insulin aspart  0-9 Units Subcutaneous TID WC  . lisinopril  20 mg Oral Daily  . pravastatin  40 mg Oral q1800    Allergies: No Known  Allergies  Social History   Socioeconomic History  . Marital status: Married    Spouse name: Not on file  . Number of children: Not on file  . Years of education: Not on file  . Highest education level: Not on file  Occupational History  . Not on file  Social Needs  . Financial resource strain: Not on file  . Food insecurity    Worry: Not on file    Inability: Not on file  . Transportation needs    Medical: Not on file    Non-medical: Not  on file  Tobacco Use  . Smoking status: Never Smoker  . Smokeless tobacco: Never Used  Substance and Sexual Activity  . Alcohol use: Not on file  . Drug use: Not on file  . Sexual activity: Not on file  Lifestyle  . Physical activity    Days per week: Not on file    Minutes per session: Not on file  . Stress: Not on file  Relationships  . Social Musician on phone: Not on file    Gets together: Not on file    Attends religious service: Not on file    Active member of club or organization: Not on file    Attends meetings of clubs or organizations: Not on file    Relationship status: Not on file  . Intimate partner violence    Fear of current or ex partner: Not on file    Emotionally abused: Not on file    Physically abused: Not on file    Forced sexual activity: Not on file  Other Topics Concern  . Not on file  Social History Narrative  . Not on file     Family History  Problem Relation Age of Onset  . CAD Mother   . CAD Father   . Diabetes Mellitus II Maternal Uncle       Review of Systems: All other systems reviewed and are otherwise negative except as noted above.  Physical Exam: Vitals:   03/15/19 1118 03/15/19 1123 03/15/19 1135 03/15/19 1151  BP: 124/68 131/62 122/68 116/81  Pulse: 84  69 76  Resp: Temp:    98 F (36.7 C)  TempSrc:    Oral  SpO2: 100% 98% 97% 100%  Weight:      Height:        GEN- The patient is well appearing, alert and oriented x 3 today.   Head- normocephalic, atraumatic Eyes-  Sclera clear, conjunctiva pink Ears- hearing intact Oropharynx- clear Neck- supple Lungs- CTA b/l, normal work of breathing Heart- RRR, no murmurs, rubs or gallops  GI- soft, NT, ND Extremities- no clubbing, cyanosis, or edema MS- no significant deformity or atrophy Skin- no rash or lesion Psych- euthymic mood, full affect   Labs:   Lab Results  Component Value Date   WBC 7.4 03/14/2019   HGB 14.2 03/14/2019   HCT  43.4 03/14/2019   MCV 89.3 03/14/2019   PLT 258 03/14/2019    Recent Labs  Lab 03/14/19 0749  NA 140  K 4.0  CL 101  CO2 28  BUN 20  CREATININE 1.09*  CALCIUM 9.1  PROT 6.5  BILITOT 0.8  ALKPHOS 34*  ALT 19  AST 17  GLUCOSE 111*   No results found for: CKTOTAL, CKMB, CKMBINDEX, TROPONINI Lab Results  Component Value Date   CHOL 173 03/14/2019   Lab Results  Component Value Date  HDL 45 03/14/2019   Lab Results  Component Value Date   LDLCALC 109 (H) 03/14/2019   Lab Results  Component Value Date   TRIG 96 03/14/2019   Lab Results  Component Value Date   CHOLHDL 3.8 03/14/2019   No results found for: LDLDIRECT  No results found for: DDIMER   Radiology/Studies:   Ct Angio Head W Or Wo Contrast Result Date: 03/14/2019 CLINICAL DATA:  Stroke workup EXAM: CT ANGIOGRAPHY HEAD AND NECK TECHNIQUE: Multidetector CT imaging of the head and neck was performed using the standard protocol during bolus administration of intravenous contrast. Multiplanar CT image reconstructions and MIPs were obtained to evaluate the vascular anatomy. Carotid stenosis measurements (when applicable) are obtained utilizing NASCET criteria, using the distal internal carotid diameter as the denominator. CONTRAST:  49mL OMNIPAQUE IOHEXOL 350 MG/ML SOLN COMPARISON:  Head CT and brain MRI earlier today FINDINGS: CTA NECK FINDINGS Aortic arch: Atherosclerotic calcification.  Three vessel branching. Right carotid system: Vessels are smooth and widely patent. No atheromatous changes noted. Left carotid system: Vessels are smooth and widely patent. No noted atheromatous changes. Vertebral arteries: No proximal subclavian stenosis or atherosclerosis. Tiny calcified plaque seen at the left vertebral origin. Both vertebral arteries are smooth and widely patent to the dura. Skeleton: Suboccipital craniectomy and C1 posterior ring resection presumably for Chiari malformation as noted on prior brain MRI. Disc  narrowing and endplate ridging with a central disc protrusion at C4-5 with calcified annulus, presumably flattening the cord. Other neck: Medial ization of the left focal fold compared to the right. Upper chest: Negative Review of the MIP images confirms the above findings CTA HEAD FINDINGS Anterior circulation: Atherosclerotic plaque on the carotid siphons. No branch occlusion, aneurysm, or flow limiting stenosis. Posterior circulation: Symmetric vertebral arteries. No branch occlusion, beading, or aneurysm. No flow limiting stenosis. Possible mild atheromatous irregularity of posterior cerebral arteries. Venous sinuses: Patent Anatomic variants: None significant Review of the MIP images confirms the above findings IMPRESSION: 1. No emergent finding. Atherosclerosis is mild and there is no flow limiting stenosis of major vessels. 2. Suboccipital decompression. 3. 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 4. 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 Electronically Signed   By: Monte Fantasia M.D.   On: 03/14/2019 09:50     Ct Head Wo Contrast Result Date: 03/14/2019 CLINICAL DATA:  Transient right upper extremity weakness EXAM: CT HEAD WITHOUT CONTRAST TECHNIQUE: Contiguous axial images were obtained from the base of the skull through the vertex without intravenous contrast. COMPARISON:  None. FINDINGS: Brain: There are postsurgical changes from prior left suboccipital craniectomy with gliosis in the left cerebellar hemisphere and expected dural thickening of the posterior fossa. Patchy areas of white matter hypoattenuation are most compatible with chronic microvascular angiopathy. No evidence of acute infarction, hemorrhage, frank hydrocephalus or extra-axial collection. Vascular: Atherosclerotic calcification of the carotid siphons. No hyperdense vessel. Skull: Prior suboccipital craniectomy and partial resection of the posterior arch of C1. No acute osseous  abnormality. No scalp swelling or hematoma. Few benign scalp calcifications are present. Sinuses/Orbits: Paranasal sinuses and mastoid air cells are predominantly clear. Included orbital structures are unremarkable. Other: None IMPRESSION: 1. No CT evidence of large territory infarct or other acute intracranial abnormality. If there is persisting clinical concern for ischemia, consider MRI evaluation. 2. Postsurgical changes from prior left suboccipital craniectomy and partial resection of the posterior arch of C1. Gliosis of the left cerebellar hemisphere could reflect postsurgical changes from prior resection  or infarct. Correlate with patient history. Electronically Signed   By: Kreg Shropshire M.D.   On: 03/14/2019 03:42      Mr Brain Wo Contrast Result Date: 03/14/2019 CLINICAL DATA:  Right upper extremity weakness that is now resolved, evaluate for TIA. EXAM: MRI HEAD WITHOUT CONTRAST TECHNIQUE: Multiplanar, multiecho pulse sequences of the brain and surrounding structures were obtained without intravenous contrast. COMPARISON:  Head CT from earlier today FINDINGS: Brain: Subcentimeter acute infarcts along the inferior right temporal cortex and along the precentral gyrus superiorly. No acute hemorrhage, hydrocephalus, or masslike finding. Ischemic and/or postoperative encephalomalacia in the bilateral inferior cerebellum after suboccipital decompression. 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 No continued foramen magnum stenosis. Mild small vessel ischemic gliosis in the cerebral white matter. Vascular: Normal flow voids Skull and upper cervical spine: Suboccipital craniectomy Sinuses/Orbits: Negative IMPRESSION: 1. 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 2. 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 There is encephalomalacia in the left more than  right inferior cerebellum. 3. Mild chronic small vessel ischemia in the cerebral white matter. Electronically Signed   By: Marnee Spring M.D.   On: 03/14/2019 06:56    12-lead ECG SR All prior EKG's in EPIC reviewed with no documented atrial fibrillation  Telemetry is reviewed by Dr. Elberta Fortis: SR  Assessment and Plan:  1. Cryptogenic stroke The patient presents with cryptogenic stroke.  Dr. Merlinda Frederick spoke at length with the patient (who has a back round in medical research) and her daughter (who is a Engineer, civil (consulting)) via facetime about monitoring for afib and recommendation for loop recorder.  Risks, benefits, and alteratives to implantable loop recorder were discussed with the patient today.   They both would like to pursue loop implant.  The patient lives in [place], [place], Dr. Elberta Fortis discussed perhaps getting back home and getting referred to an EP for implant there and follow up, vs implant here and transferring monitoring once established by her home locally with an EP.  The patient and her daughter would prefer implanting now and then getting referred and f/u locally.   Await venous US given she had a small PFO  The patient was able to independently repeat/discuss the rational for heart rhythm monitoring and loop implant.    Wound care was reviewed with the patient (keep incision clean and dry for 3 days).  Wound check scheduled for the patient  Please call with questions.   Sheilah Pigeon, PA-C 03/15/2019  I have seen and examined this patient with Francis Dowse.  Agree with above, note added to reflect my findings.  On exam, RRR, no murmurs, lungs clear.  Patient presented to the hospital with cryptogenic stroke. To date, no cause has been found. TEE planned for today. If unrevealing, Rithwik Schmieg plan for LINQ monitor to look for atrial fibrillation. Risks and benefits discussed. Risks include but not limited to bleeding and infection. The patient understands the risks and has agreed to the  procedure.  Deva Ron M. Alaysia Lightle MD 03/15/2019 2:18 PM

## 2019-03-15 NOTE — TOC Initial Note (Addendum)
Transition of Care [facility]) - Initial/Assessment Note    Patient Details  Name: Martha Kelly MRN: [id number] Date of Birth: 1945-11-07  Transition of Care [facility]) CM/SW Contact:    Pollie Friar, RN Phone Number: 03/15/2019, 4:36 PM  Clinical Narrative:                 Pt lives in [place], [place]. She was visiting her daughter in [place] [employer] [place] when she had her stroke. Plan is for her to stay with her daughter after d/c for a few weeks. Pt prefers [facility] services over outpatient. [facility] is able to see her in [place] and then in [place]. Pt in agreement. Tiffany with [facility] accepted the referral.  Daughters address: [phone] in [place] for home has been delivered to the room. Pt will have supervision at her daughters home and transportation to daughters home when d/ced.   Expected Discharge Plan: [place] Barriers to Discharge: Continued Medical Work up   Patient Goals and CMS Choice   CMS Medicare.gov Compare Post Acute Care list provided to:: Patient Choice offered to / list presented to : Patient  Expected Discharge Plan and Services Expected Discharge Plan: [place]   Discharge Planning Services: CM Consult Post Acute Care Choice: [place] arrangements for the past 2 months: Single Family Home                 DME Arranged: Kasandra Knudsen DME Agency: [employer]       HH Arranged: PT, Speech Therapy [place] Agency: [facility]) Date [place]: 03/15/19   Representative spoke with at [place]: [place]  Prior Living Arrangements/Services Living arrangements for the past 2 months: [place] Lives with:: Adult Children Patient language and need for interpreter reviewed:: Yes(no needs) Do you feel safe going back to the place where you live?: Yes      Need for Family Participation in Patient Care: No (Comment) Care giver support system in place?: Yes (comment)    Criminal Activity/Legal Involvement Pertinent to Current Situation/Hospitalization: No - Comment as needed  Activities of Daily Living Home Assistive Devices/Equipment: None ADL Screening (condition at time of admission) Patient's cognitive ability adequate to safely complete daily activities?: No Is the patient deaf or have difficulty hearing?: No Does the patient have difficulty seeing, even when wearing glasses/contacts?: No Does the patient have difficulty concentrating, remembering, or making decisions?: No Patient able to express need for assistance with ADLs?: Yes Does the patient have difficulty dressing or bathing?: No Independently performs ADLs?: Yes (appropriate for developmental age) Does the patient have difficulty walking or climbing stairs?: Yes Weakness of Legs: Left Weakness of Arms/Hands: None  Permission Sought/Granted                  Emotional Assessment Appearance:: Appears stated age Attitude/Demeanor/Rapport: Engaged Affect (typically observed): Accepting Orientation: : Oriented to  Time, Oriented to Situation, Oriented to Place, Oriented to Self   Psych Involvement: No (comment)  Admission diagnosis:  Weakness of right upper extremity [R29.898] TIA (transient ischemic attack) [G45.9] Patient Active Problem List   Diagnosis Date Noted  . TIA (transient ischemic attack) 03/14/2019  . Essential hypertension 03/14/2019  . HLD (hyperlipidemia) 03/14/2019  . Stroke (cerebrum) ([place]) 03/14/2019   PCP:  Mauro Kaufmann, MD Pharmacy:   [facility] [address]  [address] [place] [address] Phone: [phone] Fax: [phone]     Social Determinants of Health (SDOH) Interventions    Readmission Risk Interventions No flowsheet data found.

## 2019-03-18 ENCOUNTER — Encounter (HOSPITAL_COMMUNITY): Payer: Self-pay | Admitting: Cardiology

## 2019-03-20 ENCOUNTER — Encounter: Payer: Self-pay | Admitting: Cardiology

## 2019-03-20 NOTE — Progress Notes (Signed)
T-wave oversensing noted on LINQ II transmission. Decay delay extended to 300 ms to correct issue.

## 2019-03-26 ENCOUNTER — Telehealth (INDEPENDENT_AMBULATORY_CARE_PROVIDER_SITE_OTHER): Payer: Medicare Other | Admitting: Student

## 2019-03-26 ENCOUNTER — Other Ambulatory Visit: Payer: Self-pay

## 2019-03-26 DIAGNOSIS — I639 Cerebral infarction, unspecified: Secondary | ICD-10-CM

## 2019-03-26 LAB — CUP PACEART REMOTE DEVICE CHECK
Date Time Interrogation Session: 20201013150901
Implantable Pulse Generator Implant Date: 20201002

## 2019-03-26 NOTE — Progress Notes (Signed)
Virtual wound check completed. Steri-strips previously removed. Wound well healing without redness or edema by visual assessment. Confirmed by patient and daughter who was present. No new episodes on transmission of LINQ II reviewed personally today. TWOS previously addressed and programming changed with no further.  Questions answered. Monitor communicating nightly.   Legrand Como [address] [place], PA-C  03/26/2019 3:06 PM

## 2019-04-10 ENCOUNTER — Telehealth: Payer: Self-pay

## 2019-04-10 NOTE — Telephone Encounter (Signed)
Pt called to let me know she is staying in the area. She gave me her number to put in the system and I did. I let her know she was not transferred out of our system. She is still in our system and in [place] so we are able to follow her ILR still.The pt thanked me for my help.

## 2019-04-22 ENCOUNTER — Ambulatory Visit (INDEPENDENT_AMBULATORY_CARE_PROVIDER_SITE_OTHER): Payer: Medicare Other | Admitting: *Deleted

## 2019-04-22 DIAGNOSIS — I639 Cerebral infarction, unspecified: Secondary | ICD-10-CM

## 2019-04-26 LAB — CUP PACEART REMOTE DEVICE CHECK
Date Time Interrogation Session: 20201112211637
Implantable Pulse Generator Implant Date: 20201002

## 2019-05-21 NOTE — Progress Notes (Signed)
[facility] Summary Report / Loop Recorder 

## 2019-05-23 ENCOUNTER — Ambulatory Visit (INDEPENDENT_AMBULATORY_CARE_PROVIDER_SITE_OTHER): Payer: Medicare Other | Admitting: *Deleted

## 2019-05-23 DIAGNOSIS — I639 Cerebral infarction, unspecified: Secondary | ICD-10-CM | POA: Diagnosis not present

## 2019-05-27 ENCOUNTER — Encounter: Payer: Self-pay | Admitting: Cardiology

## 2019-05-27 LAB — CUP PACEART REMOTE DEVICE CHECK
Date Time Interrogation Session: 20201211201307
Implantable Pulse Generator Implant Date: 20201002

## 2019-05-27 NOTE — Progress Notes (Signed)
6 false "AF" episodes noted since implant, likely due to ectopy, noise, and occasional signal dropout/undersensing. Reprogrammed AF detection to "less sensitive" via remote reprogramming. Routed to Dr. Curt Bears as Juluis Rainier.

## 2019-06-25 ENCOUNTER — Ambulatory Visit (INDEPENDENT_AMBULATORY_CARE_PROVIDER_SITE_OTHER): Payer: Medicare PPO | Admitting: *Deleted

## 2019-06-25 DIAGNOSIS — I639 Cerebral infarction, unspecified: Secondary | ICD-10-CM | POA: Diagnosis not present

## 2019-06-27 LAB — CUP PACEART REMOTE DEVICE CHECK
Date Time Interrogation Session: 20210113201445
Implantable Pulse Generator Implant Date: 20201002

## 2019-07-29 ENCOUNTER — Ambulatory Visit (INDEPENDENT_AMBULATORY_CARE_PROVIDER_SITE_OTHER): Payer: Medicare PPO | Admitting: *Deleted

## 2019-07-29 DIAGNOSIS — I639 Cerebral infarction, unspecified: Secondary | ICD-10-CM | POA: Diagnosis not present

## 2019-07-29 LAB — CUP PACEART REMOTE DEVICE CHECK
Date Time Interrogation Session: 20210214230227
Implantable Pulse Generator Implant Date: 20201002

## 2019-07-30 NOTE — Progress Notes (Signed)
ILR Remote 

## 2019-08-24 LAB — CUP PACEART REMOTE DEVICE CHECK
Date Time Interrogation Session: 20210313010819
Implantable Pulse Generator Implant Date: 20201002

## 2019-08-26 ENCOUNTER — Ambulatory Visit (INDEPENDENT_AMBULATORY_CARE_PROVIDER_SITE_OTHER): Payer: Medicare PPO | Admitting: *Deleted

## 2019-08-26 DIAGNOSIS — I639 Cerebral infarction, unspecified: Secondary | ICD-10-CM

## 2019-08-28 ENCOUNTER — Telehealth: Payer: Self-pay

## 2019-08-28 NOTE — Telephone Encounter (Signed)
Spoke with patient to remind of missed remote transmission 

## 2019-08-29 LAB — CUP PACEART REMOTE DEVICE CHECK
Date Time Interrogation Session: 20210317230554
Implantable Pulse Generator Implant Date: 20201002

## 2019-08-29 NOTE — Progress Notes (Signed)
ILR Remote 

## 2019-09-26 ENCOUNTER — Ambulatory Visit (INDEPENDENT_AMBULATORY_CARE_PROVIDER_SITE_OTHER): Payer: Medicare PPO | Admitting: *Deleted

## 2019-09-26 DIAGNOSIS — I639 Cerebral infarction, unspecified: Secondary | ICD-10-CM | POA: Diagnosis not present

## 2019-09-29 LAB — CUP PACEART REMOTE DEVICE CHECK
Date Time Interrogation Session: 20210417230341
Implantable Pulse Generator Implant Date: 20201002

## 2019-09-30 NOTE — Progress Notes (Signed)
ILR Remote 

## 2019-10-28 ENCOUNTER — Ambulatory Visit (INDEPENDENT_AMBULATORY_CARE_PROVIDER_SITE_OTHER): Payer: Medicare PPO | Admitting: *Deleted

## 2019-10-28 DIAGNOSIS — I639 Cerebral infarction, unspecified: Secondary | ICD-10-CM

## 2019-10-30 LAB — CUP PACEART REMOTE DEVICE CHECK
Date Time Interrogation Session: 20210518230611
Implantable Pulse Generator Implant Date: 20201002

## 2019-10-30 NOTE — Progress Notes (Signed)
[facility] Summary Report / Loop Recorder 

## 2019-12-02 ENCOUNTER — Ambulatory Visit (INDEPENDENT_AMBULATORY_CARE_PROVIDER_SITE_OTHER): Payer: Medicare PPO | Admitting: *Deleted

## 2019-12-02 DIAGNOSIS — I639 Cerebral infarction, unspecified: Secondary | ICD-10-CM

## 2019-12-02 LAB — CUP PACEART REMOTE DEVICE CHECK
Date Time Interrogation Session: 20210620230539
Implantable Pulse Generator Implant Date: 20201002

## 2019-12-03 LAB — CUP PACEART REMOTE DEVICE CHECK
Date Time Interrogation Session: 20210619002557
Implantable Pulse Generator Implant Date: 20201002

## 2019-12-03 NOTE — Progress Notes (Signed)
[facility] Summary Report / Loop Recorder 

## 2020-01-06 ENCOUNTER — Ambulatory Visit (INDEPENDENT_AMBULATORY_CARE_PROVIDER_SITE_OTHER): Payer: Medicare PPO | Admitting: *Deleted

## 2020-01-06 DIAGNOSIS — I639 Cerebral infarction, unspecified: Secondary | ICD-10-CM

## 2020-01-07 LAB — CUP PACEART REMOTE DEVICE CHECK
Date Time Interrogation Session: 20210725230636
Implantable Pulse Generator Implant Date: 20201002

## 2020-01-09 NOTE — Progress Notes (Signed)
[facility] Summary Report / Loop Recorder 

## 2020-02-09 LAB — CUP PACEART REMOTE DEVICE CHECK
Date Time Interrogation Session: 20210827230542
Implantable Pulse Generator Implant Date: 20201002

## 2020-02-10 ENCOUNTER — Ambulatory Visit (INDEPENDENT_AMBULATORY_CARE_PROVIDER_SITE_OTHER): Payer: Medicare PPO | Admitting: *Deleted

## 2020-02-10 DIAGNOSIS — I639 Cerebral infarction, unspecified: Secondary | ICD-10-CM | POA: Diagnosis not present

## 2020-02-12 NOTE — Progress Notes (Signed)
[facility] Summary Report / Loop Recorder 

## 2020-02-26 ENCOUNTER — Other Ambulatory Visit: Payer: Medicare PPO

## 2020-02-26 ENCOUNTER — Other Ambulatory Visit: Payer: Self-pay | Admitting: Critical Care Medicine

## 2020-02-26 DIAGNOSIS — Z20822 Contact with and (suspected) exposure to covid-19: Secondary | ICD-10-CM

## 2020-02-28 ENCOUNTER — Other Ambulatory Visit: Payer: Self-pay

## 2020-02-28 ENCOUNTER — Other Ambulatory Visit: Payer: Medicare PPO

## 2020-02-28 DIAGNOSIS — Z20822 Contact with and (suspected) exposure to covid-19: Secondary | ICD-10-CM

## 2020-02-28 LAB — NOVEL CORONAVIRUS, NAA: SARS-CoV-2, NAA: NOT DETECTED

## 2020-02-28 LAB — SARS-COV-2, NAA 2 DAY TAT

## 2020-03-02 LAB — NOVEL CORONAVIRUS, NAA: SARS-CoV-2, NAA: NOT DETECTED

## 2020-03-16 ENCOUNTER — Ambulatory Visit (INDEPENDENT_AMBULATORY_CARE_PROVIDER_SITE_OTHER): Payer: Medicare PPO

## 2020-03-16 DIAGNOSIS — I639 Cerebral infarction, unspecified: Secondary | ICD-10-CM

## 2020-03-16 LAB — CUP PACEART REMOTE DEVICE CHECK
Date Time Interrogation Session: 20210929230504
Implantable Pulse Generator Implant Date: 20201002

## 2020-03-18 NOTE — Progress Notes (Signed)
[facility] Summary Report / Loop Recorder 

## 2020-04-17 LAB — CUP PACEART REMOTE DEVICE CHECK
Date Time Interrogation Session: 20211101230338
Implantable Pulse Generator Implant Date: 20201002

## 2020-04-20 ENCOUNTER — Ambulatory Visit (INDEPENDENT_AMBULATORY_CARE_PROVIDER_SITE_OTHER): Payer: Medicare PPO

## 2020-04-20 DIAGNOSIS — I639 Cerebral infarction, unspecified: Secondary | ICD-10-CM | POA: Diagnosis not present

## 2020-04-21 NOTE — Progress Notes (Signed)
[facility] Summary Report / Loop Recorder 

## 2020-05-24 LAB — CUP PACEART REMOTE DEVICE CHECK
Date Time Interrogation Session: 20211204230302
Implantable Pulse Generator Implant Date: 20201002

## 2020-05-25 ENCOUNTER — Ambulatory Visit (INDEPENDENT_AMBULATORY_CARE_PROVIDER_SITE_OTHER): Payer: Medicare PPO

## 2020-05-25 DIAGNOSIS — I639 Cerebral infarction, unspecified: Secondary | ICD-10-CM | POA: Diagnosis not present

## 2020-06-09 NOTE — Progress Notes (Signed)
[facility] Summary Report / Loop Recorder 

## 2020-06-29 ENCOUNTER — Ambulatory Visit (INDEPENDENT_AMBULATORY_CARE_PROVIDER_SITE_OTHER): Payer: Medicare PPO

## 2020-06-29 DIAGNOSIS — I639 Cerebral infarction, unspecified: Secondary | ICD-10-CM | POA: Diagnosis not present

## 2020-07-02 LAB — CUP PACEART REMOTE DEVICE CHECK
Date Time Interrogation Session: 20220115230417
Implantable Pulse Generator Implant Date: 20201002

## 2020-07-14 NOTE — Progress Notes (Signed)
[facility] Summary Report / Loop Recorder 

## 2020-07-17 IMAGING — CT CT ANGIO HEAD
2 of 7 series · 8 of 33 positions shown · IV contrast (OMNI 350)
Comparison: Head CT and brain MRI earlier today

CLINICAL DATA: Stroke workup

EXAM:
CT ANGIOGRAPHY HEAD AND NECK
TECHNIQUE: Multidetector CT imaging of the head and neck was performed using
the standard protocol during bolus administration of intravenous
contrast. Multiplanar CT image reconstructions and MIPs were
obtained to evaluate the vascular anatomy. Carotid stenosis
measurements (when applicable) are obtained utilizing NASCET
criteria, using the distal internal carotid diameter as the
denominator.
CONTRAST:  50mL OMNIPAQUE IOHEXOL 350 MG/ML SOLN

[Series 3: cta neck · axial · 0.52mm/px · z∈[-209,-93]mm · 2 of 174 slices shown]
[im 58/174  soft-tissue]
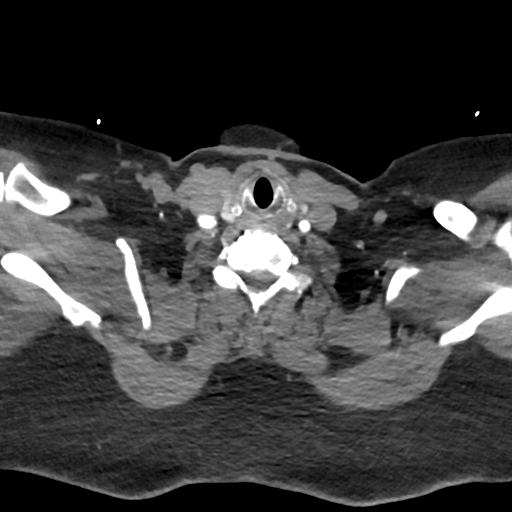
[im 116/174  bone]
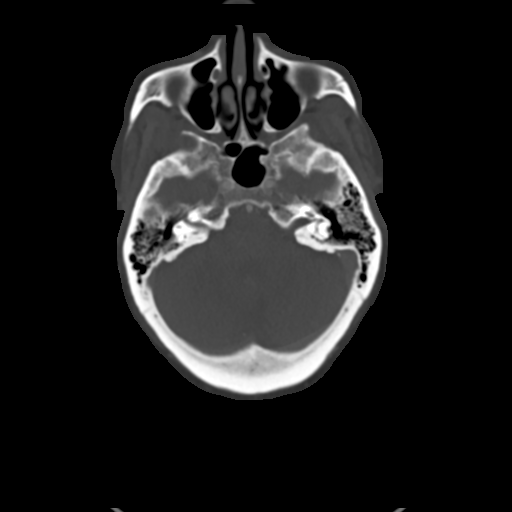

[Series 5: cta neck axial · axial · 0.39mm/px · z∈[-299,-57]mm · 6 of 347 slices shown]
[im 50/347  soft-tissue]
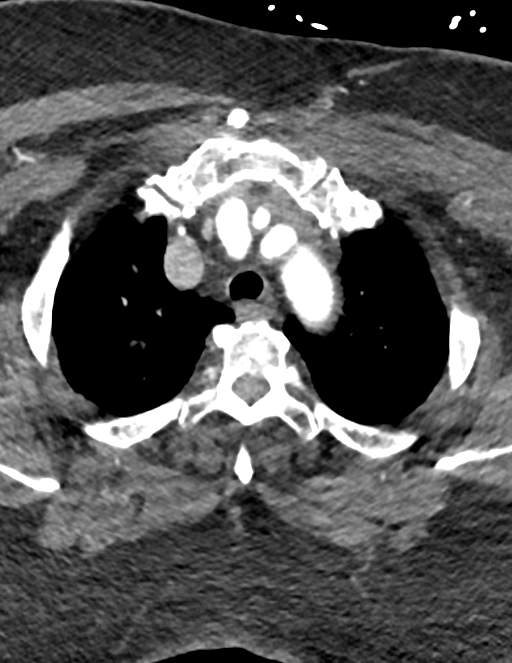
[im 99/347  soft-tissue]
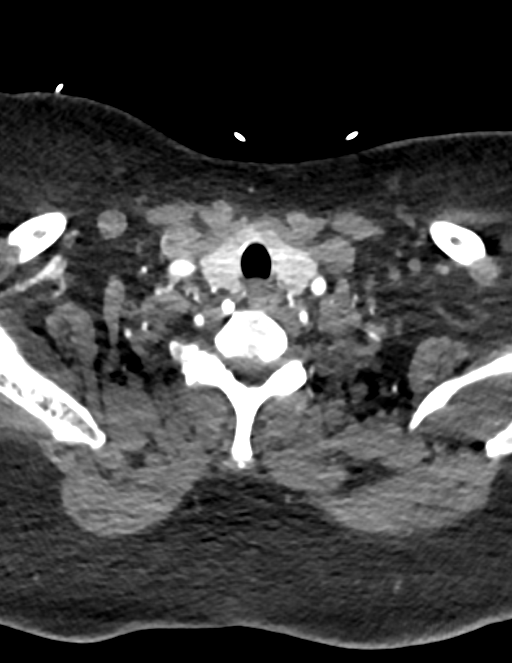
[im 149/347  soft-tissue]
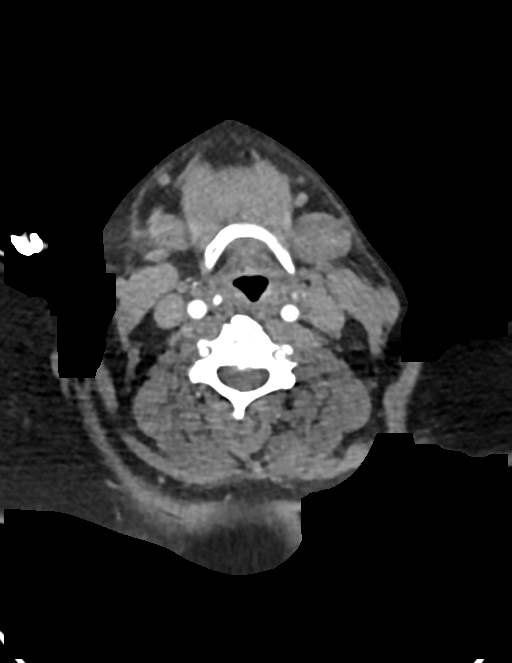
[im 198/347  soft-tissue]
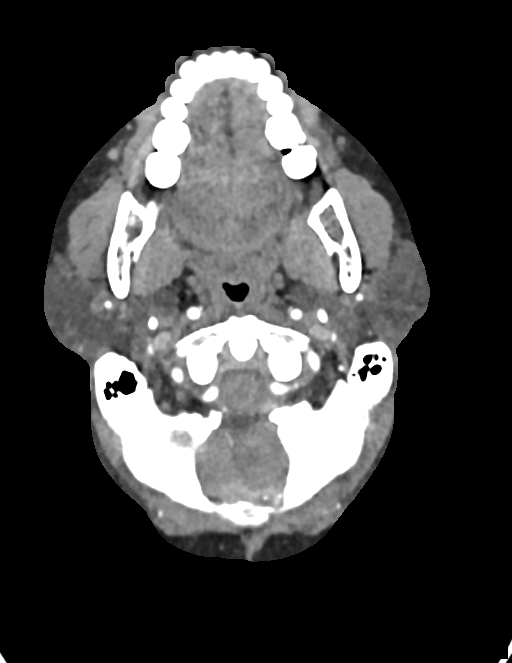
[im 248/347  soft-tissue]
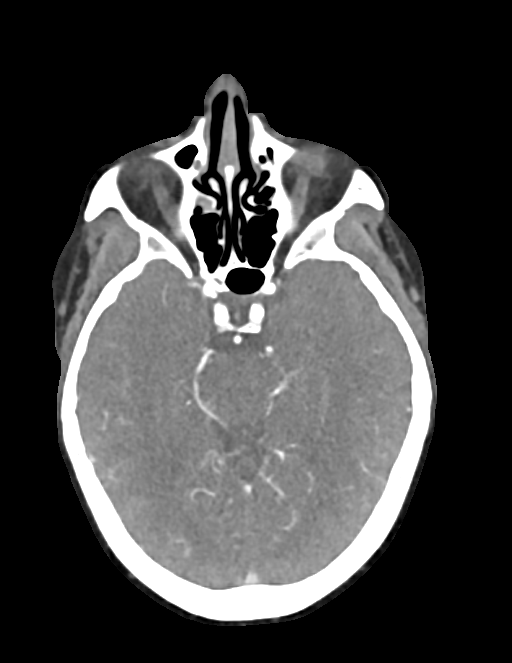
[im 297/347  soft-tissue]
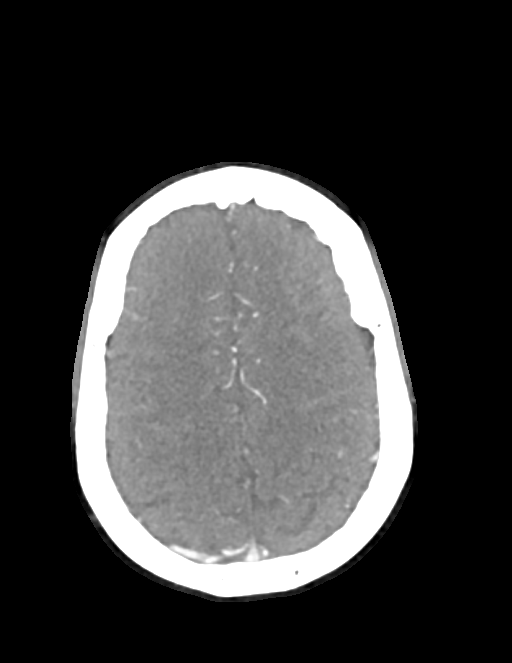

[8 of 33 positions shown; findings below may reference images not displayed]

FINDINGS: CTA NECK FINDINGS

Aortic arch: Atherosclerotic calcification.  Three vessel branching.

Right carotid system: Vessels are smooth and widely patent. No
atheromatous changes noted.

Left carotid system: Vessels are smooth and widely patent. No noted
atheromatous changes.

Vertebral arteries: No proximal subclavian stenosis or
atherosclerosis. Tiny calcified plaque seen at the left vertebral
origin. Both vertebral arteries are smooth and widely patent to the
dura.

Skeleton: Suboccipital craniectomy and C1 posterior ring resection
presumably for Chiari malformation as noted on prior brain MRI. Disc
narrowing and endplate ridging with a central disc protrusion at
C4-5 with calcified annulus, presumably flattening the cord.

Other neck: Medial ization of the left focal fold compared to the
right.

Upper chest: Negative

Review of the MIP images confirms the above findings

CTA HEAD FINDINGS

Anterior circulation: Atherosclerotic plaque on the carotid siphons.
No branch occlusion, aneurysm, or flow limiting stenosis.

Posterior circulation: Symmetric vertebral arteries. No branch
occlusion, beading, or aneurysm. No flow limiting stenosis. Possible
mild atheromatous irregularity of posterior cerebral arteries.

Venous sinuses: Patent

Anatomic variants: None significant

Review of the MIP images confirms the above findings
IMPRESSION: 1. No emergent finding. Atherosclerosis is mild and there is no flow
limiting stenosis of major vessels.
2. Suboccipital decompression.
3. Cervical spine degeneration with notable chronic C4-5 disc
protrusion with implied cord impingement.
4. Suspect left vocal cord paresis.

## 2020-07-17 IMAGING — MR MR HEAD W/O CM
12 of 13 series · 43 of 48 positions shown · non-contrast
Comparison: Head CT from earlier today

CLINICAL DATA: Right upper extremity weakness that is now resolved,
evaluate for TIA.

EXAM:
MRI HEAD WITHOUT CONTRAST
TECHNIQUE: Multiplanar, multiecho pulse sequences of the brain and surrounding
structures were obtained without intravenous contrast.

[Series 5: DWI · axial · 3.0mm · 0.88mm/px · z∈[-87,+44]mm · 6 of 91 slices shown (1 of 4)]
[im 1/91]
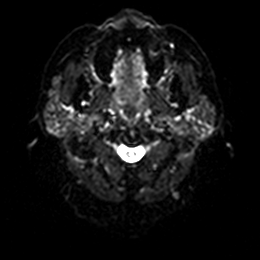
[im 19/91]
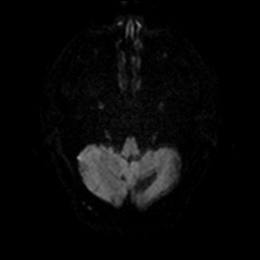
[im 37/91]
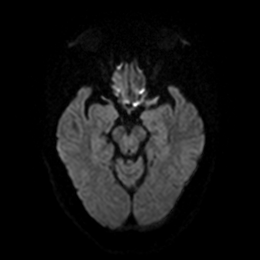
[im 55/91]
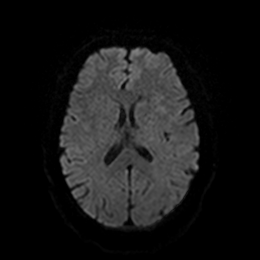
[im 73/91]
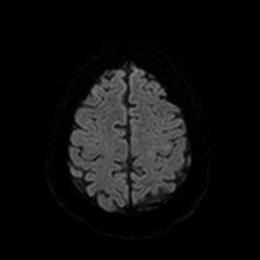
[im 91/91]
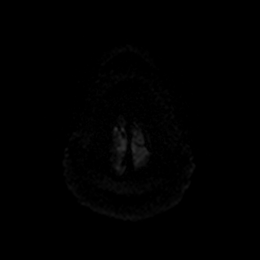

[Series 6: DWI · axial · 3.0mm · 0.88mm/px · z∈[-87,+44]mm · 3 of 46 slices shown (2 of 4)]
[im 1/46]
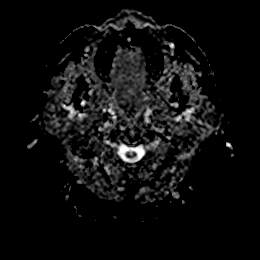
[im 23/46]
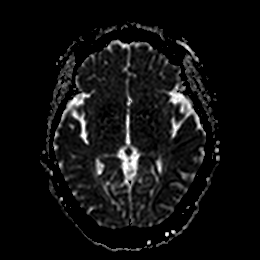
[im 46/46]
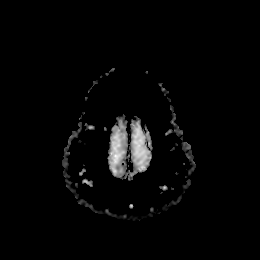

[Series 7: DWI · coronal · 4.0mm · 0.88mm/px · 5 of 64 slices shown (3 of 4)]
[im 1/64]
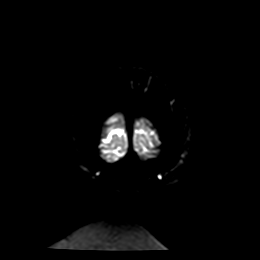
[im 16/64]
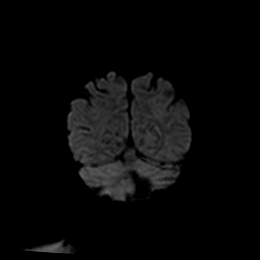
[im 32/64]
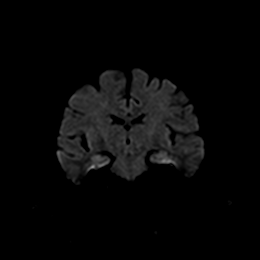
[im 48/64]
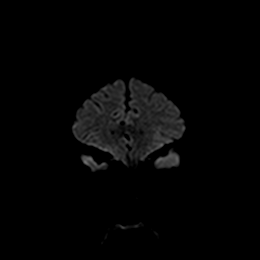
[im 64/64]
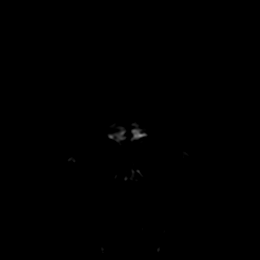

[Series 8: DWI · coronal · 4.0mm · 0.88mm/px · 2 of 32 slices shown (4 of 4)]
[im 1/32]
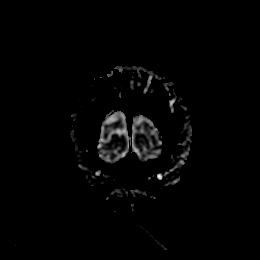
[im 32/32]
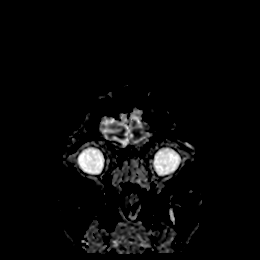

[Series 9: T1 · sagittal · 5.0mm · 0.75mm/px · 2 of 23 slices shown]
[im 1/23]
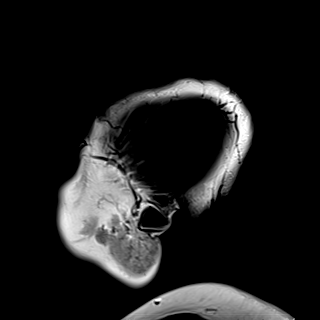
[im 23/23]
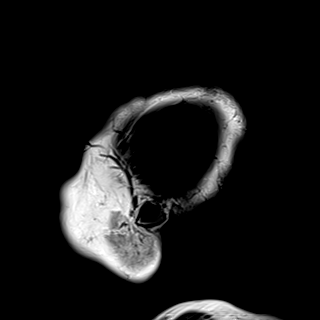

[Series 10: T2 · axial · 5.0mm · 0.72mm/px · z∈[-92,+48]mm · 2 of 25 slices shown (1 of 2)]
[im 1/25]
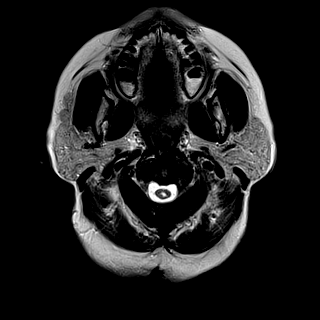
[im 25/25]
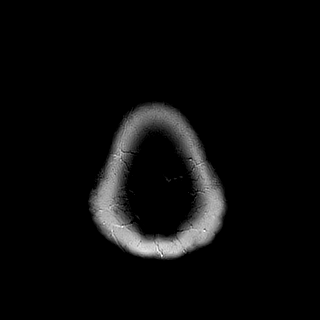

[Series 11: FLAIR · axial · 5.0mm · 0.45mm/px · z∈[-92,+49]mm · 2 of 25 slices shown]
[im 1/25]
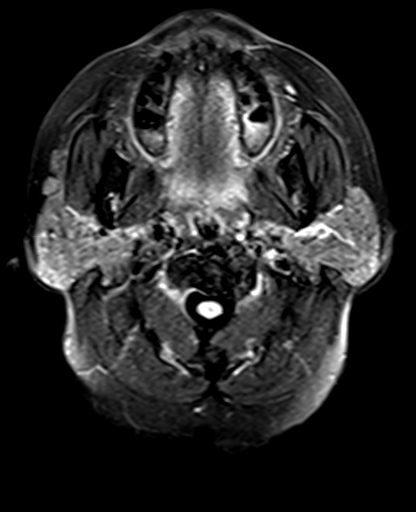
[im 25/25]
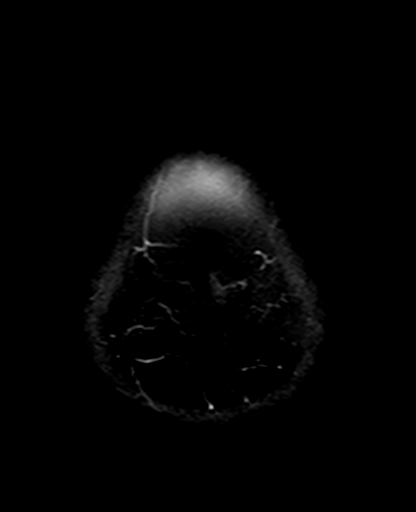

[Series 12: mag_images · axial · 3.0mm · 0.90mm/px · z∈[-99,+74]mm · 5 of 60 slices shown]
[im 1/60]
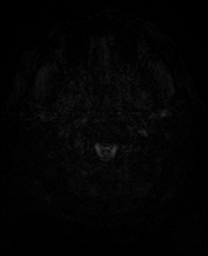
[im 15/60]
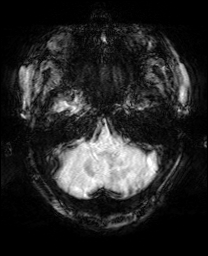
[im 30/60]
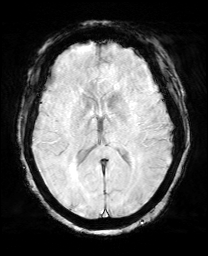
[im 45/60]
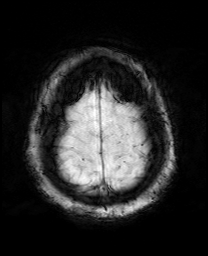
[im 60/60]
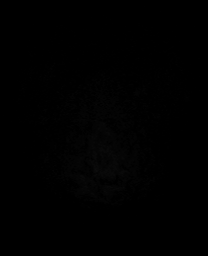

[Series 13: pha_images · axial · 3.0mm · 0.90mm/px · z∈[-99,+74]mm · 5 of 60 slices shown]
[im 1/60]
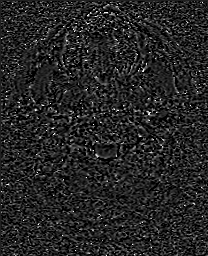
[im 15/60]
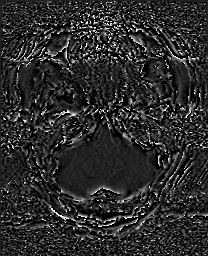
[im 30/60]
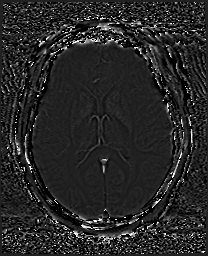
[im 45/60]
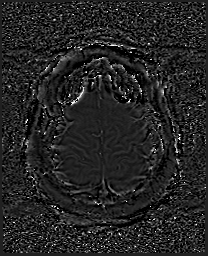
[im 60/60]
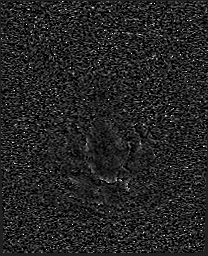

[Series 14: swi_images · axial · 3.0mm · 0.90mm/px · z∈[-99,+74]mm · 5 of 60 slices shown]
[im 1/60]
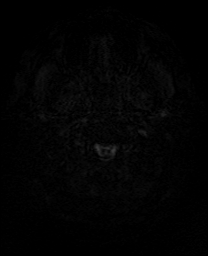
[im 15/60]
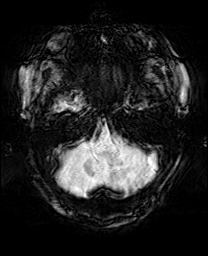
[im 30/60]
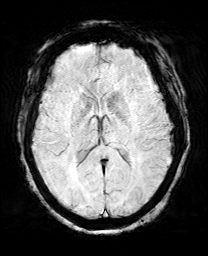
[im 45/60]
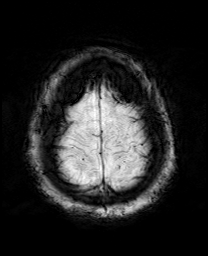
[im 60/60]
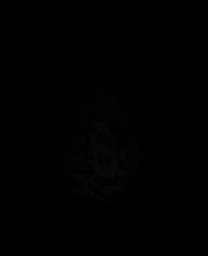

[Series 15: mip_images(sw) · axial · 24.0mm · 0.90mm/px · z∈[-89,+64]mm · 4 of 53 slices shown]
[im 1/53]
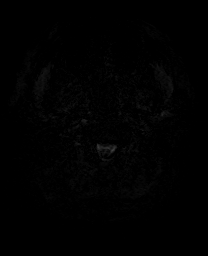
[im 18/53]
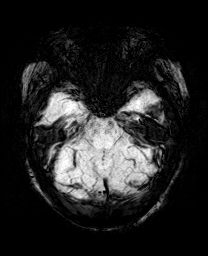
[im 35/53]
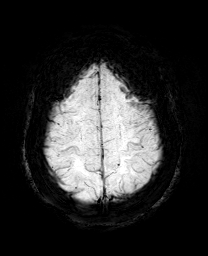
[im 53/53]
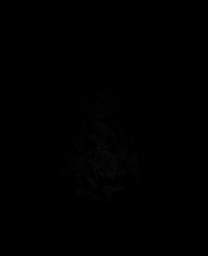

[Series 17: T2 · coronal · 5.0mm · 0.34mm/px · 2 of 29 slices shown (2 of 2)]
[im 1/29]
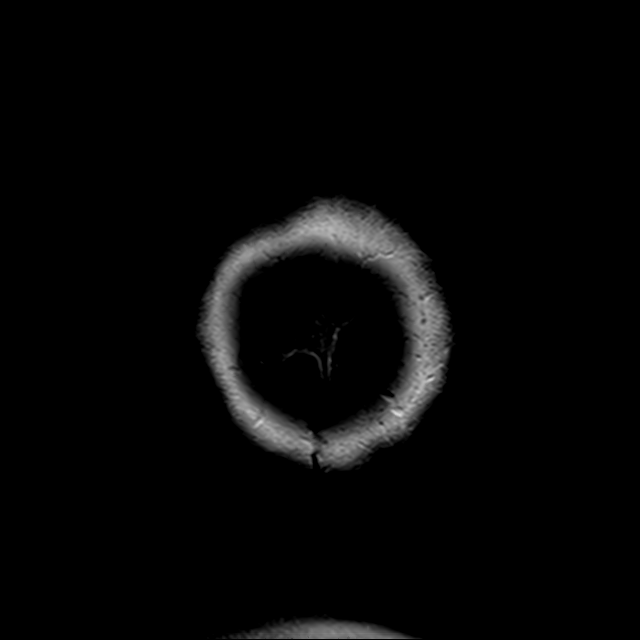
[im 29/29]
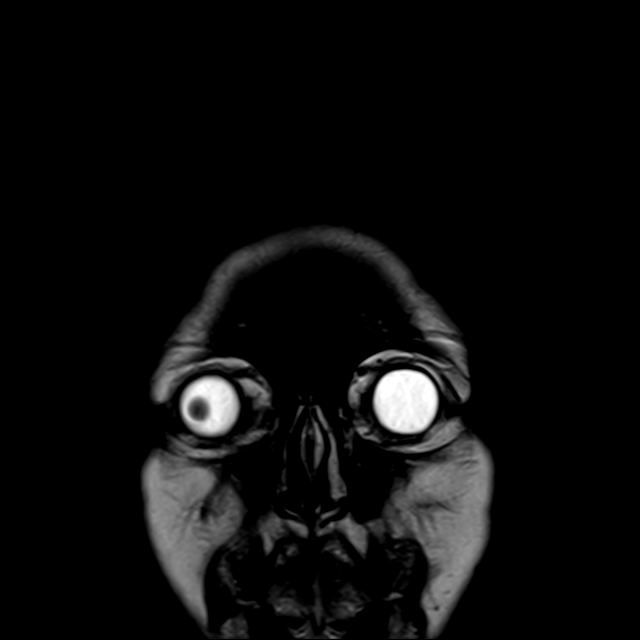

[43 of 48 positions shown; findings below may reference images not displayed]

FINDINGS: Brain: Subcentimeter acute infarcts along the inferior right
temporal cortex and along the precentral gyrus superiorly. No acute
hemorrhage, hydrocephalus, or masslike finding. Ischemic and/or
postoperative encephalomalacia in the bilateral inferior cerebellum
after suboccipital decompression. There is a small syrinx in the
upper cervical cord measuring 3 mm in diameter. No continued foramen
magnum stenosis.

Mild small vessel ischemic gliosis in the cerebral white matter.

Vascular: Normal flow voids

Skull and upper cervical spine: Suboccipital craniectomy

Sinuses/Orbits: Negative
IMPRESSION: 1. Small acute infarcts in the left precentral gyrus and inferior
right temporal cortex suggesting embolic disease.
2. Suboccipital craniectomy presumably for Chiari 1 malformation
with small associated syrinx. There is encephalomalacia in the left
more than right inferior cerebellum.
3. Mild chronic small vessel ischemia in the cerebral white matter.

## 2020-08-03 ENCOUNTER — Ambulatory Visit (INDEPENDENT_AMBULATORY_CARE_PROVIDER_SITE_OTHER): Payer: Medicare PPO

## 2020-08-03 DIAGNOSIS — I639 Cerebral infarction, unspecified: Secondary | ICD-10-CM

## 2020-08-05 LAB — CUP PACEART REMOTE DEVICE CHECK
Date Time Interrogation Session: 20220217230655
Implantable Pulse Generator Implant Date: 20201002

## 2020-08-11 NOTE — Progress Notes (Signed)
[facility] Summary Report / Loop Recorder 

## 2020-08-24 ENCOUNTER — Telehealth: Payer: Self-pay | Admitting: Emergency Medicine

## 2020-08-24 NOTE — Telephone Encounter (Signed)
2 [facility] Alerts received for A-fib. Patient is not on an OAC. Contacted patient and patient states that she did feel different over the weekend. Patient denies any signs and symptoms today. Patient agreeable to referral to the A-fib Clinic. Dr. Ladona Ridgel also recommended A-fib Clinic for patient.  Note: Patient states she has recently relocated and that she does not have a new EP or cardiologist at this time.

## 2020-08-24 NOTE — Telephone Encounter (Signed)
Patient's current contact number [phone].

## 2020-08-24 NOTE — Telephone Encounter (Signed)
Patient called back. Patient has moved out of the [place] and advised that she would not be able to to make an appointment with the A-fib Clinic. Patient states that she has an appointment with her PCP on Wednesday 08/26/20 and has currently not selected a new cardiologist or EP physician. Patient inquiring about recommendations until she can see her PCP. Currently no OAC listed. I advised patient that I would route this to the A-fib Clinic. Best contact number for patient is [phone].

## 2020-08-24 NOTE — Telephone Encounter (Signed)
Called and spoke with patient, she is agreeable to MyChart Video/Telephone Visit 08/25/20 at 10:30 am.  Pt states that she has appt with her PCP Dr. Laray Anger in [place], [place] which is where she currently lives.

## 2020-08-25 ENCOUNTER — Other Ambulatory Visit: Payer: Self-pay

## 2020-08-25 ENCOUNTER — Ambulatory Visit (HOSPITAL_COMMUNITY)
Admission: RE | Admit: 2020-08-25 | Discharge: 2020-08-25 | Disposition: A | Discharge status: Home / Self Care | Payer: Medicare PPO | Source: Ambulatory Visit | Attending: Physician Assistant | Admitting: Physician Assistant

## 2020-08-25 ENCOUNTER — Encounter (HOSPITAL_COMMUNITY): Payer: Self-pay | Admitting: Physician Assistant

## 2020-08-25 VITALS — Ht 65.0 in | Wt 239.8 lb

## 2020-08-25 DIAGNOSIS — D6869 Other thrombophilia: Secondary | ICD-10-CM

## 2020-08-25 DIAGNOSIS — I480 Paroxysmal atrial fibrillation: Secondary | ICD-10-CM | POA: Diagnosis not present

## 2020-08-25 MED ORDER — APIXABAN 5 MG PO TABS: 5.0000 mg | ORAL_TABLET | Freq: Two times a day (BID) | ORAL | 3 refills | Status: DC

## 2020-08-25 NOTE — Progress Notes (Signed)
Electrophysiology TeleHealth Note   Audio/video telehealth visit is felt to be most appropriate for this patient at this time.  See consent below from today for patient consent regarding telehealth for the Atrial Fibrillation Clinic.    Date:  08/25/2020   ID:  Martha Kelly, DOB 04-Jan-1946, MRN [id number]  Location: home Provider location: [address] [place], [place] [address] Evaluation Performed: Follow up  PCP:  Marney Setting Ralene Ok, MD  Primary Electrophysiologist: Dr Elberta Fortis Referring provider: Dr Camnitz/device clinic   History of Present Illness: Martha Kelly is a 75 y.o. female who presents via audio/video conferencing for a telehealth visit today. She has a PMH of HTN, prior CVA, and atrial fibrillation who presents for follow up in the [facility].  The patient was initially diagnosed with atrial fibrillation on ILR 08/24/20. She had the ILR placed in 2020 for a cryptogenic stroke. Patient has a CHADS2VASC score of 5. Patient did have some brief lightheadedness but unsure if this was related to her afib. There did not appear to be any specific triggers.   Today, she denies symptoms of palpitations, chest pain, shortness of breath, orthopnea, PND, lower extremity edema, claudication, dizziness, presyncope, syncope, bleeding, or neurologic sequela. The patient is tolerating medications without difficulties and is otherwise without complaint today.    Atrial Fibrillation Risk Factors:  she does not have symptoms or diagnosis of sleep apnea. she does not have a history of rheumatic fever.   she has a BMI of Body mass index is 39.9 kg/m.Marland Kitchen Filed Weights   08/25/20 1017  Weight: 108.8 kg     Atrial Fibrillation Management history:  Previous antiarrhythmic drugs: none Previous cardioversions: none Previous ablations: none CHADS2VASC score: 5 Anticoagulation history: none   Past Medical History:  Diagnosis Date  .  Hypertension   . Prediabetes    Past Surgical History:  Procedure Laterality Date  . ABDOMINAL HYSTERECTOMY    . BUBBLE STUDY  03/15/2019   Procedure: BUBBLE STUDY;  Surgeon: Lewayne Bunting, MD;  Location: [facility] ENDOSCOPY;  Service: Cardiovascular;;  . LOOP RECORDER INSERTION N/A 03/15/2019   Procedure: LOOP RECORDER INSERTION;  Surgeon: Regan Lemming, MD;  Location: [facility] INVASIVE CV LAB;  Service: Cardiovascular;  Laterality: N/A;  . NECK SURGERY    . TEE WITHOUT CARDIOVERSION N/A 03/15/2019   Procedure: TRANSESOPHAGEAL ECHOCARDIOGRAM (TEE);  Surgeon: Lewayne Bunting, MD;  Location: [facility] ENDOSCOPY;  Service: Cardiovascular;  Laterality: N/A;     Current Outpatient Medications  Medication Sig Dispense Refill  . acetaminophen (TYLENOL) 500 MG tablet Take 500 mg by mouth every 6 (six) hours as needed for mild pain or moderate pain.    Marland Kitchen apixaban (ELIQUIS) 5 MG TABS tablet Take 1 tablet (5 mg total) by mouth 2 (two) times daily. 60 tablet 3  . atorvastatin (LIPITOR) 40 MG tablet Take 1 tablet (40 mg total) by mouth daily. 90 tablet 3  . Cholecalciferol (VITAMIN D) 50 MCG (2000 UT) tablet Take 2,000 Units by mouth daily.    . diclofenac Sodium (VOLTAREN) 1 % GEL APPLY TO AFFECTED AREA FOUR TIMES DAILY AS NEEDED FOR SHOULDER OSTEOARTHRITIS.    . hydrochlorothiazide (HYDRODIURIL) 25 MG tablet Take 25 mg by mouth daily.    Marland Kitchen HYDROcodone-acetaminophen (NORCO/VICODIN) 5-325 MG tablet Take 1 tablet by mouth every 4 (four) hours as needed.    Marland Kitchen lisinopril (ZESTRIL) 40 MG tablet Take 1 tablet by mouth daily.    . metFORMIN (GLUCOPHAGE) 500 MG  tablet Take 500 mg by mouth 2 (two) times daily.     No current facility-administered medications for this encounter.    Allergies:   Patient has no known allergies.   Social History:  The patient  reports that she has never smoked. She has never used smokeless tobacco. She reports that she does not drink alcohol and does not use drugs.   Family  History:  The patient's  family history includes CAD in her father and mother; Diabetes Mellitus II in her maternal uncle.    ROS:  Please see the history of present illness.   All other systems are personally reviewed and negative.   Exam: Well appearing, alert and conversant, regular work of breathing,  good skin color  Recent Labs: No results found for requested labs within last 8760 hours.  personally reviewed    Echo 03/14/19 1. Left ventricular ejection fraction, by visual estimation, is 60 to  65%. The left ventricle has normal function. Normal left ventricular size.  There is no left ventricular hypertrophy.  2. Left ventricular diastolic Doppler parameters are consistent with  impaired relaxation pattern of LV diastolic filling.  3. Global right ventricle has normal systolic function.The right  ventricular size is normal. No increase in right ventricular wall  thickness.  4. Left atrial size was normal.  5. Right atrial size was normal.  6. The mitral valve is normal in structure. No evidence of mitral valve  regurgitation. No evidence of mitral stenosis.  7. The tricuspid valve is normal in structure. Tricuspid valve  regurgitation was not visualized by color flow Doppler.  8. The aortic valve is tricuspid Aortic valve regurgitation was not  visualized by color flow Doppler. Mild aortic valve sclerosis without  stenosis.  9. The pulmonic valve was normal in structure. Pulmonic valve  regurgitation is not visualized by color flow Doppler.  10. The inferior vena cava is normal in size with greater than 50%  respiratory variability, suggesting right atrial pressure of 3 mmHg.  11. Cannot exclude ASD/PFO. Consider transesophageal echocardiogram, if  clinically indicated.  12. The interatrial septum was not well visualized.    CHA2DS2-VASc Score = 5  The patient's score is based upon: CHF History: No HTN History: Yes Diabetes History: No Stroke History:  Yes Vascular Disease History: No Age Score: 1 Gender Score: 1      ASSESSMENT AND PLAN: 1. Paroxysmal Atrial Fibrillation (ICD10:  I48.0) The patient's CHA2DS2-VASc score is 5, indicating a 7.2% annual risk of stroke.   General education about afib provided and questions answered. We also discussed her stroke risk and the risks and benefits of anticoagulation. ILR shows she is back in SR. Will plan to stop ASA and start Eliquis 5 mg BID. Labs in care everywhere reviewed.   2. Secondary Hypercoagulable State (ICD10:  D68.69) The patient is at significant risk for stroke/thromboembolism based upon her CHA2DS2-VASc Score of 5.  Start Apixaban (Eliquis).   3. HTN No changes today.   Patient has an appointment with her PCP tomorrow, she will get referral to establish with cardiology there in [place]. AF clinic as needed.   Current medicines are reviewed at length with the patient today.   The patient does not have concerns regarding her medicines.  The following changes were made today:  Stop ASA, start Eliquis  Labs/ tests ordered today include:  No orders of the defined types were placed in this encounter.   Patient Risk:  after full review of this patients  clinical status, I feel that they are at moderate risk at this time.   Today, I have spent 17 minutes with the patient with telehealth technology discussing the above.    Dalia Heading PA-C 08/25/2020 10:54 AM  Afib Clinic [facility] [address] [place], [place] [address] [phone]   I hereby voluntarily request, consent and authorize the Atrial Fibrillation Clinic and its employed or contracted physicians, physician assistants, nurse practitioners or other licensed health care professionals (the Practitioner), to provide me with telemedicine health care services (the "Services") as deemed necessary by the treating Practitioner. I acknowledge and consent to receive the Services by the  Practitioner via telemedicine. I understand that the telemedicine visit will involve communicating with the Practitioner through live audiovisual communication technology and the disclosure of certain medical information by electronic transmission. I acknowledge that I have been given the opportunity to request an in-person assessment or other available alternative prior to the telemedicine visit and am voluntarily participating in the telemedicine visit.   I understand that I have the right to withhold or withdraw my consent to the use of telemedicine in the course of my care at any time, without affecting my right to future care or treatment, and that the Practitioner or I may terminate the telemedicine visit at any time. I understand that I have the right to inspect all information obtained and/or recorded in the course of the telemedicine visit and may receive copies of available information for a reasonable fee.  I understand that some of the potential risks of receiving the Services via telemedicine include:   Delay or interruption in medical evaluation due to technological equipment failure or disruption;  Information transmitted may not be sufficient (e.g. poor resolution of images) to allow for appropriate medical decision making by the Practitioner; and/or  In rare instances, security protocols could fail, causing a breach of personal health information.   Furthermore, I acknowledge that it is my responsibility to provide information about my medical history, conditions and care that is complete and accurate to the best of my ability. I acknowledge that Practitioner's advice, recommendations, and/or decision may be based on factors not within their control, such as incomplete or inaccurate data provided by me or distortions of diagnostic images or specimens that may result from electronic transmissions. I understand that the practice of medicine is not an exact science and that Practitioner makes no  warranties or guarantees regarding treatment outcomes. I acknowledge that I will receive a copy of this consent concurrently upon execution via email to the email address I last provided but may also request a printed copy by calling the office of the Atrial Fibrillation Clinic.  I understand that my insurance will be billed for this visit.   I have read or had this consent read to me.  I understand the contents of this consent, which adequately explains the benefits and risks of the Services being provided via telemedicine.  I have been provided ample opportunity to ask questions regarding this consent and the Services and have had my questions answered to my satisfaction.  I give my informed consent for the services to be provided through the use of telemedicine in my medical care  By participating in this telemedicine visit I agree to the above.

## 2020-09-06 LAB — CUP PACEART REMOTE DEVICE CHECK
Date Time Interrogation Session: 20220322230258
Implantable Pulse Generator Implant Date: 20201002

## 2020-09-07 ENCOUNTER — Ambulatory Visit (INDEPENDENT_AMBULATORY_CARE_PROVIDER_SITE_OTHER): Payer: Medicare PPO

## 2020-09-07 DIAGNOSIS — I639 Cerebral infarction, unspecified: Secondary | ICD-10-CM | POA: Diagnosis not present

## 2020-09-18 NOTE — Progress Notes (Signed)
[facility] Summary Report / Loop Recorder 

## 2020-10-05 ENCOUNTER — Ambulatory Visit (INDEPENDENT_AMBULATORY_CARE_PROVIDER_SITE_OTHER): Payer: Medicare PPO

## 2020-10-05 DIAGNOSIS — I639 Cerebral infarction, unspecified: Secondary | ICD-10-CM

## 2020-10-05 LAB — EXTERNAL GENERIC LAB PROCEDURE: COLOGUARD: NEGATIVE

## 2020-10-05 LAB — COLOGUARD: COLOGUARD: NEGATIVE

## 2020-10-06 LAB — CUP PACEART REMOTE DEVICE CHECK
Date Time Interrogation Session: 20220424230251
Implantable Pulse Generator Implant Date: 20201002

## 2020-10-23 NOTE — Progress Notes (Signed)
[facility] Summary Report / Loop Recorder 

## 2021-03-02 ENCOUNTER — Other Ambulatory Visit (HOSPITAL_COMMUNITY): Payer: Self-pay | Admitting: Physician Assistant

## 2023-10-31 ENCOUNTER — Ambulatory Visit: Admitting: Orthopaedic Surgery

## 2023-11-14 ENCOUNTER — Ambulatory Visit: Admitting: Orthopaedic Surgery

## 2023-11-14 ENCOUNTER — Other Ambulatory Visit (INDEPENDENT_AMBULATORY_CARE_PROVIDER_SITE_OTHER): Payer: Self-pay

## 2023-11-14 DIAGNOSIS — M25561 Pain in right knee: Secondary | ICD-10-CM | POA: Diagnosis not present

## 2023-11-14 DIAGNOSIS — M1711 Unilateral primary osteoarthritis, right knee: Secondary | ICD-10-CM

## 2023-11-14 MED ORDER — LIDOCAINE HCL 1 % IJ SOLN
2.0000 mL | INTRAMUSCULAR | Status: AC | PRN
  Administered 2023-11-14: 2 mL

## 2023-11-14 MED ORDER — METHYLPREDNISOLONE ACETATE 40 MG/ML IJ SUSP
40.0000 mg | INTRAMUSCULAR | Status: AC | PRN
  Administered 2023-11-14: 40 mg via INTRA_ARTICULAR

## 2023-11-14 MED ORDER — BUPIVACAINE HCL 0.5 % IJ SOLN
2.0000 mL | INTRAMUSCULAR | Status: AC | PRN
Start: 2023-11-14 — End: 2023-11-14
  Administered 2023-11-14: 2 mL via INTRA_ARTICULAR

## 2023-11-14 NOTE — Progress Notes (Signed)
 Office Visit Note   Patient: Martha Kelly           Date of Birth: 04-Nov-1945           MRN: [id number] Visit Date: 11/14/2023              Requested by: Curlee Doss, MD No address on file PCP: Curlee Doss, MD   Assessment & Plan: Visit Diagnoses:  1. Primary osteoarthritis of right knee     Plan: History of Present Illness Martha Kelly is a 78 year old female who presents with right knee pain. She is accompanied by her daughter.  The right knee pain began two months ago, characterized by soreness and stiffness without trauma or injury. Pain is localized to the lateral side of the knee, with no pain on the medial side or during knee flexion. There is no swelling, popping, locking, or instability. She has used Tylenol , Voltaren gel, and hydrocodone with some relief.  Physical Exam MUSCULOSKELETAL: No effusion in right knee joint. Pain on lateral aspect of right knee. No pain on medial aspect of right knee.  Assessment and Plan Osteoarthritis of right knee Chronic osteoarthritis flare-up. X-ray shows advanced disease in the lateral compartment. Pain partially managed with acetaminophen , diclofenac, and hydrocodone. Discussed cortisone injection benefits and risks, including temporary pain increase and elevated blood glucose. Explained alternative treatments: home exercises, weight management, and naturopathic options. Provided information on gel injection as a future option. - Administer cortisone injection. - Provide home exercises to strengthen knee muscles. - Discuss weight management to reduce knee pain. - Provide information on naturopathic options such as fish oils and turmeric. - Give pamphlet on gel injection for future consideration.  Follow-Up Instructions: No follow-ups on file.   Orders:  Orders Placed This Encounter  Procedures   Large Joint Inj   XR KNEE 3 VIEW RIGHT   No orders of the defined types were placed in this  encounter.     Procedures: Large Joint Inj: R knee on 11/14/2023 12:20 PM Indications: pain Details: 22 G needle  Arthrogram: No  Medications: 40 mg methylPREDNISolone acetate 40 MG/ML; 2 mL lidocaine  1 %; 2 mL bupivacaine 0.5 % Consent was given by the patient. Patient was prepped and draped in the usual sterile fashion.       Clinical Data: No additional findings.   Subjective: Chief Complaint  Patient presents with   Right Knee - Pain    HPI  Review of Systems  Constitutional: Negative.   HENT: Negative.    Eyes: Negative.   Respiratory: Negative.    Cardiovascular: Negative.   Endocrine: Negative.   Musculoskeletal: Negative.   Neurological: Negative.   Hematological: Negative.   Psychiatric/Behavioral: Negative.    All other systems reviewed and are negative.    Objective: Vital Signs: There were no vitals taken for this visit.  Physical Exam Vitals and nursing note reviewed.  Constitutional:      Appearance: She is well-developed.  HENT:     Head: Atraumatic.     Nose: Nose normal.  Eyes:     Extraocular Movements: Extraocular movements intact.  Cardiovascular:     Pulses: Normal pulses.  Pulmonary:     Effort: Pulmonary effort is normal.  Abdominal:     Palpations: Abdomen is soft.  Musculoskeletal:     Cervical back: Neck supple.  Skin:    General: Skin is warm.     Capillary Refill: Capillary refill takes less than 2 seconds.  Neurological:     Mental Status: She is alert. Mental status is at baseline.  Psychiatric:        Behavior: Behavior normal.        Thought Content: Thought content normal.        Judgment: Judgment normal.     Ortho Exam  Specialty Comments:  No specialty comments available.  Imaging: XR KNEE 3 VIEW RIGHT Result Date: 11/14/2023 X-rays of the right knee show moderately severe arthritis in the lateral compartment with subchondral sclerosis and osteophytic spurring.    PMFS History: Patient Active  Problem List   Diagnosis Date Noted   Paroxysmal atrial fibrillation (HCC) 08/25/2020   Secondary hypercoagulable state (HCC) 08/25/2020   PFO (patent foramen ovale) 03/15/2019   Essential hypertension 03/14/2019   HLD (hyperlipidemia) 03/14/2019   Cryptogenic stroke (HCC) 03/14/2019   Past Medical History:  Diagnosis Date   Hypertension    Prediabetes     Family History  Problem Relation Age of Onset   CAD Mother    CAD Father    Diabetes Mellitus II Maternal Uncle     Past Surgical History:  Procedure Laterality Date   ABDOMINAL HYSTERECTOMY     BUBBLE STUDY  03/15/2019   Procedure: BUBBLE STUDY;  Surgeon: Lenise Quince, MD;  Location: [facility] ENDOSCOPY;  Service: Cardiovascular;;   LOOP RECORDER INSERTION N/A 03/15/2019   Procedure: LOOP RECORDER INSERTION;  Surgeon: Lei Pump, MD;  Location: [facility] INVASIVE CV LAB;  Service: Cardiovascular;  Laterality: N/A;   NECK SURGERY     TEE WITHOUT CARDIOVERSION N/A 03/15/2019   Procedure: TRANSESOPHAGEAL ECHOCARDIOGRAM (TEE);  Surgeon: Lenise Quince, MD;  Location: [facility] ENDOSCOPY;  Service: Cardiovascular;  Laterality: N/A;   Social History   Occupational History   Not on file  Tobacco Use   Smoking status: Never   Smokeless tobacco: Never  Substance and Sexual Activity   Alcohol use: Never   Drug use: Never   Sexual activity: Not on file

## 2023-12-26 ENCOUNTER — Other Ambulatory Visit (INDEPENDENT_AMBULATORY_CARE_PROVIDER_SITE_OTHER): Payer: Self-pay

## 2023-12-26 ENCOUNTER — Ambulatory Visit: Admitting: Orthopaedic Surgery

## 2023-12-26 ENCOUNTER — Encounter: Payer: Self-pay | Admitting: Orthopaedic Surgery

## 2023-12-26 DIAGNOSIS — M25562 Pain in left knee: Secondary | ICD-10-CM | POA: Diagnosis not present

## 2023-12-26 DIAGNOSIS — G8929 Other chronic pain: Secondary | ICD-10-CM

## 2023-12-26 MED ORDER — LIDOCAINE HCL 1 % IJ SOLN
2.0000 mL | INTRAMUSCULAR | Status: AC | PRN
  Administered 2023-12-26: 2 mL

## 2023-12-26 MED ORDER — METHYLPREDNISOLONE ACETATE 40 MG/ML IJ SUSP
40.0000 mg | INTRAMUSCULAR | Status: AC | PRN
  Administered 2023-12-26: 40 mg via INTRA_ARTICULAR

## 2023-12-26 MED ORDER — BUPIVACAINE HCL 0.5 % IJ SOLN
2.0000 mL | INTRAMUSCULAR | Status: AC | PRN
  Administered 2023-12-26: 2 mL via INTRA_ARTICULAR

## 2023-12-26 NOTE — Progress Notes (Signed)
 Office Visit Note   Patient: Martha Kelly           Date of Birth: 27-Apr-1946           MRN: [id number] Visit Date: 12/26/2023              Requested by: Lynnette Karilyn Helm, MD No address on file PCP: Lynnette Karilyn Helm, MD   Assessment & Plan: Visit Diagnoses:  1. Chronic pain of left knee     Plan: History of Present Illness The patient, with osteoarthritis, presents with left knee pain.  She experiences left knee pain following improvement of right knee pain after an injection. She inquires about the use of diclofenac, having switched from a prescription to over-the-counter Voltaren, and is uncertain about returning to the prescription version.  Physical Exam MUSCULOSKELETAL: Left knee without effusion, mild tenderness, otherwise normal.  Assessment and Plan Acute left knee pain likely osteoarthritis flare Acute left knee pain with mild tenderness, no effusion. Prefers injection based on previous successful response in the right knee. - Administer injection to the left knee for pain relief. - Continue using over-the-counter Voltaren 1% gel for topical pain management.  Follow-Up Instructions: No follow-ups on file.   Orders:  Orders Placed This Encounter  Procedures   Large Joint Inj: L knee   XR KNEE 3 VIEW LEFT   No orders of the defined types were placed in this encounter.     Procedures: Large Joint Inj: L knee on 12/26/2023 9:46 AM Details: 22 G needle Medications: 2 mL bupivacaine  0.5 %; 2 mL lidocaine  1 %; 40 mg methylPREDNISolone  acetate 40 MG/ML Outcome: tolerated well, no immediate complications Patient was prepped and draped in the usual sterile fashion.       Clinical Data: No additional findings.   Subjective: Chief Complaint  Patient presents with   Left Knee - Pain    HPI  Review of Systems  Constitutional: Negative.   HENT: Negative.    Eyes: Negative.   Respiratory: Negative.    Cardiovascular: Negative.    Endocrine: Negative.   Musculoskeletal: Negative.   Neurological: Negative.   Hematological: Negative.   Psychiatric/Behavioral: Negative.    All other systems reviewed and are negative.    Objective: Vital Signs: There were no vitals taken for this visit.  Physical Exam Vitals and nursing note reviewed.  Constitutional:      Appearance: She is well-developed.  HENT:     Head: Atraumatic.     Nose: Nose normal.  Eyes:     Extraocular Movements: Extraocular movements intact.  Cardiovascular:     Pulses: Normal pulses.  Pulmonary:     Effort: Pulmonary effort is normal.  Abdominal:     Palpations: Abdomen is soft.  Musculoskeletal:     Cervical back: Neck supple.  Skin:    General: Skin is warm.     Capillary Refill: Capillary refill takes less than 2 seconds.  Neurological:     Mental Status: She is alert. Mental status is at baseline.  Psychiatric:        Behavior: Behavior normal.        Thought Content: Thought content normal.        Judgment: Judgment normal.     Ortho Exam  Specialty Comments:  No specialty comments available.  Imaging: XR KNEE 3 VIEW LEFT Result Date: 12/26/2023 X-rays left knee show tricompartment arthritic changes that are mild to moderate.  No acute abnormalities.    PMFS History: Patient Active  Problem List   Diagnosis Date Noted   Paroxysmal atrial fibrillation (HCC) 08/25/2020   Secondary hypercoagulable state (HCC) 08/25/2020   PFO (patent foramen ovale) 03/15/2019   Essential hypertension 03/14/2019   HLD (hyperlipidemia) 03/14/2019   Cryptogenic stroke (HCC) 03/14/2019   Past Medical History:  Diagnosis Date   Hypertension    Prediabetes     Family History  Problem Relation Age of Onset   CAD Mother    CAD Father    Diabetes Mellitus II Maternal Uncle     Past Surgical History:  Procedure Laterality Date   ABDOMINAL HYSTERECTOMY     BUBBLE STUDY  03/15/2019   Procedure: BUBBLE STUDY;  Surgeon: Pietro Redell RAMAN, MD;  Location: [facility];  Service: Cardiovascular;;   LOOP RECORDER INSERTION N/A 03/15/2019   Procedure: LOOP RECORDER INSERTION;  Surgeon: Inocencio Soyla Lunger, MD;  Location: [facility];  Service: Cardiovascular;  Laterality: N/A;   NECK SURGERY     TEE WITHOUT CARDIOVERSION N/A 03/15/2019   Procedure: TRANSESOPHAGEAL ECHOCARDIOGRAM (TEE);  Surgeon: Pietro Redell RAMAN, MD;  Location: [facility] ENDOSCOPY;  Service: Cardiovascular;  Laterality: N/A;   Social History   Occupational History   Not on file  Tobacco Use   Smoking status: Never   Smokeless tobacco: Never  Substance and Sexual Activity   Alcohol use: Never   Drug use: Never   Sexual activity: Not on file

## 2024-02-02 ENCOUNTER — Ambulatory Visit: Payer: Medicare PPO | Admitting: Family Medicine

## 2024-02-02 ENCOUNTER — Encounter: Payer: Self-pay | Admitting: Family Medicine

## 2024-02-02 VITALS — BP 132/80 | HR 93 | Temp 98.2°F | Ht 63.66 in | Wt 233.8 lb

## 2024-02-02 DIAGNOSIS — Z95818 Presence of other cardiac implants and grafts: Secondary | ICD-10-CM

## 2024-02-02 DIAGNOSIS — R413 Other amnesia: Secondary | ICD-10-CM | POA: Diagnosis not present

## 2024-02-02 DIAGNOSIS — I10 Essential (primary) hypertension: Secondary | ICD-10-CM | POA: Diagnosis not present

## 2024-02-02 DIAGNOSIS — M150 Primary generalized (osteo)arthritis: Secondary | ICD-10-CM | POA: Diagnosis not present

## 2024-02-02 DIAGNOSIS — Z7689 Persons encountering health services in other specified circumstances: Secondary | ICD-10-CM

## 2024-02-02 DIAGNOSIS — I4891 Unspecified atrial fibrillation: Secondary | ICD-10-CM

## 2024-02-02 DIAGNOSIS — Z789 Other specified health status: Secondary | ICD-10-CM

## 2024-02-02 DIAGNOSIS — Z8673 Personal history of transient ischemic attack (TIA), and cerebral infarction without residual deficits: Secondary | ICD-10-CM

## 2024-02-02 DIAGNOSIS — E785 Hyperlipidemia, unspecified: Secondary | ICD-10-CM

## 2024-02-02 NOTE — Progress Notes (Signed)
 Established Patient Office Visit   Subjective  Patient ID: Martha Kelly, female    DOB: Jul 25, 1945  Age: 78 y.o. MRN: [id number]  No chief complaint on file. Patient accompanied by her daughter.  Pt is a 78 year old female seen for establish care and follow-up on chronic conditions.  Her daughter has observed changes in her processing and memory over the past year, although she does not perceive any memory issues herself. She underwent memory testing in May 2025, which included tasks such as remembering passwords and drawing a clock.  H/o prediabetes, previously managed with metformin until May 2025 when it was d/c'd due to controlled blood sugar levels.  States A1c has been borderline between 6.0 and 7.0.  Does not recall being told she had diabetes however occasionally checks her blood sugar.  Working on a healthy diet.   S/p cryptogenic stroke in October 2020.  Received treatment at [facility]. She was under neurology care and had a loop recorder placed in 2020, which she believes is now expired. She opted not to replace it. No irregular heartbeat felt.  Arthritis in both knees and left shoulder. Recent injections at [facility] and knees were helpful.  Previously given hydrocodone 5 mg prescribed for arthritis pain.  Her current medications include lisinopril , HCTZ, Eliquis , over-the-counter vitamin D , and Repatha injections every two weeks.  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 She does not use tobacco, alcohol, or drugs. She consumes about 64 ounces of water daily, drinks very little coffee, and avoids soft drinks.    Patient Active Problem List   Diagnosis Date Noted   Paroxysmal atrial fibrillation (HCC) 08/25/2020   Secondary hypercoagulable state (HCC) 08/25/2020   PFO (patent foramen ovale) 03/15/2019   Essential hypertension 03/14/2019   HLD (hyperlipidemia) 03/14/2019   Cryptogenic stroke (HCC) 03/14/2019   Past  Medical History:  Diagnosis Date   Hypertension    Prediabetes    Past Surgical History:  Procedure Laterality Date   ABDOMINAL HYSTERECTOMY     BUBBLE STUDY  03/15/2019   Procedure: BUBBLE STUDY;  Surgeon: Pietro Redell RAMAN, MD;  Location: [facility];  Service: Cardiovascular;;   LOOP RECORDER INSERTION N/A 03/15/2019   Procedure: LOOP RECORDER INSERTION;  Surgeon: Inocencio Soyla Lunger, MD;  Location: [facility];  Service: Cardiovascular;  Laterality: N/A;   NECK SURGERY     TEE WITHOUT CARDIOVERSION N/A 03/15/2019   Procedure: TRANSESOPHAGEAL ECHOCARDIOGRAM (TEE);  Surgeon: Pietro Redell RAMAN, MD;  Location: [facility] ENDOSCOPY;  Service: Cardiovascular;  Laterality: N/A;   Social History   Tobacco Use   Smoking status: Never   Smokeless tobacco: Never  Substance Use Topics   Alcohol use: Never   Drug use: Never   Family History  Problem Relation Age of Onset   CAD Mother    CAD Father    Diabetes Mellitus II Maternal Uncle    No Known Allergies  ROS Negative unless stated above    Objective:     There were no vitals taken for this visit. BP Readings from Last 3 Encounters:  03/15/19 (!) 136/120   Wt Readings from Last 3 Encounters:  08/25/20 239 lb 12.8 oz (108.8 kg)  03/15/19 253 lb 8.5 oz (115 kg)      Physical Exam Constitutional:      General: She is not in acute distress.    Appearance: Normal appearance.  HENT:     Head: Normocephalic and atraumatic.  Nose: Nose normal.     Mouth/Throat:     Mouth: Mucous membranes are moist.  Cardiovascular:     Rate and Rhythm: Normal rate and regular rhythm.     Heart sounds: Normal heart sounds. No murmur heard.    No gallop.  Pulmonary:     Effort: Pulmonary effort is normal. No respiratory distress.     Breath sounds: Normal breath sounds. No wheezing, rhonchi or rales.  Skin:    General: Skin is warm and dry.  Neurological:     Mental Status: She is alert and oriented to person, place, and time.         02/02/2024   10:24 AM  Depression screen PHQ 2/9  Decreased Interest 0  Down, Depressed, Hopeless 0  PHQ - 2 Score 0  Altered sleeping 0  Tired, decreased energy 0  Change in appetite 0  Feeling bad or failure about yourself  0  Trouble concentrating 0  Moving slowly or fidgety/restless 0  Suicidal thoughts 0  PHQ-9 Score 0      02/02/2024   10:24 AM  GAD 7 : Generalized Anxiety Score  Nervous, Anxious, on Edge 0  Control/stop worrying 0  Worry too much - different things 0  Trouble relaxing 0  Restless 0  Easily annoyed or irritable 0  Afraid - awful might happen 0  Total GAD 7 Score 0     No results found for any visits on 02/02/24.    Assessment & Plan:   Atrial fibrillation, unspecified type [facility]) -     Ambulatory referral to Cardiology  Primary osteoarthritis involving multiple joints  Memory change  Essential hypertension -     Ambulatory referral to Cardiology  History of stroke -     Ambulatory referral to Cardiology  Hyperlipidemia, unspecified hyperlipidemia type -     Ambulatory referral to Cardiology  Implantable loop recorder present -     Ambulatory referral to Cardiology  Encounter to establish care  Statin intolerance  Prediabetes   Previously on metformin, which was discontinued due to controlled blood sugar.  Unclear if A1c was ever in the diabetic range.  Will obtain records from previous provider to confirm. Continue lifestyle modifications including diet and exercise.  Hypertension   Managed with lisinopril  and HCTZ.  Encourage home blood pressure monitoring and continue the current antihypertensive regimen.  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 Recent injections have improved symptoms. Continue management with Ortho.  History of ischemic stroke October 2020. The loop recorder placed.  Medical management.  Refer to cardiology.  Chronic kidney disease,  unspecified stage   Previously noted borderline elevated creatinine levels. Monitor renal function with periodic creatinine levels and encourage adequate hydration.  Hyperlipidemia   Continue Repatha due to a statin intolerance causing joint pain.  Refer to cardiology.  Atrial fibrillation Asymptomatic.  Loop recorder previously placed.  Refer to Cards.  Memory changes Delay in responses to questions/change in focus.  Will obtain information from previous provider regarding any recent testing that may have been done.  Will have patient follow-up in the next few weeks to further evaluate.  I personally spent a total of 52 minutes in the care of the patient today including getting/reviewing separately obtained history, performing a medically appropriate exam/evaluation, counseling and educating, placing orders, referring and communicating with other health care professionals, documenting clinical information in the EHR, independently interpreting results, communicating results, and coordinating care.  Return in about 5 weeks (around 03/08/2024).   Clotilda JONELLE Single, MD

## 2024-02-02 NOTE — Patient Instructions (Signed)
 It was nice meeting you today.  We will try to get some additional information from your previous provider.  We will plan to set up a follow-up in the next few weeks to further review ongoing concerns.  A referral was placed to the cardiologist.  They will contact you in regards to setting up the appointment.  You can schedule screening mammogram by calling the breast center at [facility] imaging at [phone].

## 2024-02-06 ENCOUNTER — Other Ambulatory Visit: Payer: Self-pay | Admitting: Family Medicine

## 2024-02-06 NOTE — Telephone Encounter (Unsigned)
 Copied from CRM [id number]. Topic: Clinical - Medication Refill >> Feb 06, 2024  2:39 PM Chiquita SQUIBB wrote: Medication: apixaban  apixaban  (ELIQUIS ) 5 MG TABS tablet   Has the patient contacted their pharmacy? Yes (Agent: If no, request that the patient contact the pharmacy for the refill. If patient does not wish to contact the pharmacy document the reason why and proceed with request.) (Agent: If yes, when and what did the pharmacy advise?)  This is the patient's preferred pharmacy:  [id number] [place], [place] - [address] AT [facility] [address] [place] [address] Phone: [phone] Fax: [phone]    Is this the correct pharmacy for this prescription? Yes If no, delete pharmacy and type the correct one.   Has the prescription been filled recently? No  Is the patient out of the medication? No- 2 days left   Has the patient been seen for an appointment in the last year OR does the patient have an upcoming appointment? Yes  Can we respond through MyChart? Yes  Agent: Please be advised that Rx refills may take up to 3 business days. We ask that you follow-up with your pharmacy.

## 2024-02-08 MED ORDER — APIXABAN 5 MG PO TABS: 5.0000 mg | ORAL_TABLET | Freq: Two times a day (BID) | ORAL | 3 refills | Status: DC

## 2024-02-15 ENCOUNTER — Telehealth: Payer: Self-pay | Admitting: *Deleted

## 2024-02-15 ENCOUNTER — Ambulatory Visit: Payer: Self-pay

## 2024-02-15 NOTE — Telephone Encounter (Signed)
Patient has appt 9/5

## 2024-02-15 NOTE — Telephone Encounter (Signed)
 Copied from CRM #[id number]. Topic: Clinical - Medical Advice >> Feb 15, 2024  8:12 AM Turkey A wrote: Reason for CRM: Patient said that her daughter and grandson both tested positive for Covid and that she has been around them both yesterday. Patient would like to know what should she do. Patient states she woke up sneezing this morning-please contact

## 2024-02-15 NOTE — Telephone Encounter (Signed)
 Patient already has video visit scheduled on 02/16/24 to discuss tx options.   FYI Only or Action Required?: FYI only for provider.  Patient was last seen in primary care on 02/02/2024 by Mercer Clotilda SAUNDERS, MD.  Called Nurse Triage reporting Covid Positive.  Symptoms began today.  Interventions attempted: OTC medications: coricidin, tylenol .  Symptoms are: gradually worsening.  Triage Disposition: See Today or Tomorrow in Office (overriding Call PCP Within 24 Hours) - via video visit  Patient/caregiver understands and will follow disposition?:  Reason for Disposition  [1] HIGH RISK patient (e.g., weak immune system, age > 64 years, obesity with BMI 30 or higher, pregnant, chronic lung disease or other chronic medical condition) AND [2] COVID symptoms (e.g., cough, fever)  (Exceptions: Already seen by PCP and no new or worsening symptoms.)  Answer Assessment - Initial Assessment Questions Pt is taking coricidin. States has not had covid before.  1. COVID-19 DIAGNOSIS: How do you know that you have COVID? (e.g., positive lab test or self-test, diagnosed by doctor or NP/PA, symptoms after exposure).     Tested positive at home today  2. COVID-19 EXPOSURE: Was there any known exposure to COVID before the symptoms began? CDC Definition of close contact: within 6 feet (2 meters) for a total of 15 minutes or more over a 24-hour period.      Yes, daughter had covid  3. ONSET: When did the COVID-19 symptoms start?      Nasal congestion  4. WORST SYMPTOM: What is your worst symptom? (e.g., cough, fever, shortness of breath, muscle aches)     Congestion  5. COUGH: Do you have a cough?      Cough  6. FEVER: Do you have a fever? If Yes, ask: What is your temperature, how was it measured, and when did it start?     Denies  Protocols used: Coronavirus (COVID-19) Diagnosed or Suspected-A-AH   Copied from CRM #[id number]. Topic: Clinical - Medical Advice >> Feb 15, 2024  8:12 AM  [place]key A wrote: Reason for CRM: Patient said that her daughter and grandson both tested positive for Covid and that she has been around them both yesterday. Patient would like to know what should she do. Patient states she woke up sneezing this morning-please contact >> Feb 15, 2024 10:27 AM Larissa RAMAN wrote: Patient requesting a callback from nurse to discuss positive Covid test.

## 2024-02-15 NOTE — Telephone Encounter (Signed)
 Patient has been exposed to covid sat 8/30 and wed 9/3 patient's daughter and grandson have tested pos, patient is having runny nose and congestion which started today, patient is going to get a Covid test today, and will call back to make a Video visit.

## 2024-02-16 ENCOUNTER — Encounter: Payer: Self-pay | Admitting: Family Medicine

## 2024-02-16 ENCOUNTER — Telehealth: Admitting: Family Medicine

## 2024-02-16 DIAGNOSIS — R067 Sneezing: Secondary | ICD-10-CM | POA: Diagnosis not present

## 2024-02-16 DIAGNOSIS — U071 COVID-19: Secondary | ICD-10-CM

## 2024-02-16 DIAGNOSIS — R051 Acute cough: Secondary | ICD-10-CM | POA: Diagnosis not present

## 2024-02-16 DIAGNOSIS — R0981 Nasal congestion: Secondary | ICD-10-CM | POA: Diagnosis not present

## 2024-02-16 MED ORDER — MOLNUPIRAVIR EUA 200MG CAPSULE: 4.0000 | ORAL_CAPSULE | Freq: Two times a day (BID) | ORAL | 0 refills | Status: AC

## 2024-02-16 NOTE — Progress Notes (Signed)
 Virtual Visit via Video Note  I connected with Martha Kelly on 02/16/24 at  1:00 PM EDT by a video enabled telemedicine application and verified that I am speaking with the correct person using two identifiers.  Location patient: home Location provider:work or home office Persons participating in the virtual visit: patient, provider  I discussed the limitations of evaluation and management by telemedicine and the availability of in person appointments. The patient expressed understanding and agreed to proceed. Chief Complaint  Patient presents with   Covid Positive    Pt reports she got tested positive for Covid yesterday. Coughing-clear, sneezing, postal nasal drip. Denied sore throat, fevers, body ache, fatigue, headache. Exposure to daughter on Saturday and Sunday. Grandson covid positive on Wednesday. Sx started on Wednesday. Taking extra strength Cold/flu med, extra strength tylenol .      HPI:  Pt is a 78 yo female seen for acute concern.  Pt was around her daughter last wknd who tested positive for COVID earlier this wknd.  Her grandson also tested positive days later.  Pt developed cough, sneezing, nasal congestion on Wed.  Denies change in appetite, chills, fever, ear pain/pressure. Pt tested positive on Thurs, 02/15/24. Pt taking Coricidin Max strength and Tylenol  Extra Strength.   ROS: See pertinent positives and negatives per HPI.  Past Medical History:  Diagnosis Date   Hypertension    Prediabetes     Past Surgical History:  Procedure Laterality Date   ABDOMINAL HYSTERECTOMY     BUBBLE STUDY  03/15/2019   Procedure: BUBBLE STUDY;  Surgeon: Pietro Redell RAMAN, MD;  Location: [facility] ENDOSCOPY;  Service: Cardiovascular;;   LOOP RECORDER INSERTION N/A 03/15/2019   Procedure: LOOP RECORDER INSERTION;  Surgeon: Inocencio Soyla Lunger, MD;  Location: [facility];  Service: Cardiovascular;  Laterality: N/A;   NECK SURGERY     TEE WITHOUT CARDIOVERSION N/A 03/15/2019    Procedure: TRANSESOPHAGEAL ECHOCARDIOGRAM (TEE);  Surgeon: Pietro Redell RAMAN, MD;  Location: [facility] ENDOSCOPY;  Service: Cardiovascular;  Laterality: N/A;    Family History  Problem Relation Age of Onset   CAD Mother    CAD Father    Diabetes Mellitus II Maternal Uncle       Current Outpatient Medications:    acetaminophen  (TYLENOL ) 500 MG tablet, Take 500 mg by mouth every 6 (six) hours as needed for mild pain or moderate pain., Disp: , Rfl:    apixaban  (ELIQUIS ) 5 MG TABS tablet, Take 1 tablet (5 mg total) by mouth 2 (two) times daily., Disp: 60 tablet, Rfl: 3   Cholecalciferol  (VITAMIN D3) 50 MCG (2000 UT) capsule, Take 2,000 Units by mouth daily., Disp: , Rfl:    diclofenac Sodium (VOLTAREN) 1 % GEL, APPLY TO AFFECTED AREA FOUR TIMES DAILY AS NEEDED FOR SHOULDER OSTEOARTHRITIS., Disp: , Rfl:    hydrochlorothiazide (HYDRODIURIL) 25 MG tablet, Take 25 mg by mouth daily., Disp: , Rfl:    lisinopril  (ZESTRIL ) 40 MG tablet, Take 1 tablet by mouth daily., Disp: , Rfl:    REPATHA SURECLICK 140 MG/ML SOAJ, SMARTSIG:140 Milligram(s) SUB-Q Every 2 Weeks, Disp: , Rfl:    HYDROcodone-acetaminophen  (NORCO/VICODIN) 5-325 MG tablet, Take 1 tablet by mouth every 4 (four) hours as needed. (Patient not taking: Reported on 02/16/2024), Disp: , Rfl:   EXAM:  VITALS per patient if applicable:  RR between 12-20 bpm  GENERAL: alert, oriented, appears well and in no acute distress  HEENT: atraumatic, conjunctiva clear, no obvious abnormalities on inspection of external nose and ears  NECK:  normal movements of the head and neck  LUNGS: on inspection no signs of respiratory distress, breathing rate appears normal, no obvious gross SOB, gasping or wheezing  CV: no obvious cyanosis  MS: moves all visible extremities without noticeable abnormality  PSYCH/NEURO: pleasant and cooperative, no obvious depression or anxiety, speech and thought processing grossly intact  ASSESSMENT AND PLAN:  Discussed the  following assessment and plan:  COVID-19 virus infection - Plan: molnupiravir  EUA (LAGEVRIO ) 200 mg CAPS capsule  Nasal congestion  Sneezing  Acute cough  Acute viral uri sx 2/2 COVID 19.  Positive home COVID test 02/15/24 with sx starting 9/3.  Discussed r/b/a of antiviral meds.  Advised insurance may not cover cost of med.  Rx for molnupiravir  sent to pharmacy instead of paxlovid as no recent renal fx and pt on eliquis .  Continue supportive care with OTC meds.  Precautions reviewed.    F/u prn   I discussed the assessment and treatment plan with the patient. The patient was provided an opportunity to ask questions and all were answered. The patient agreed with the plan and demonstrated an understanding of the instructions.   The patient was advised to call back or seek an in-person evaluation if the symptoms worsen or if the condition fails to improve as anticipated.   Clotilda JONELLE Single, MD

## 2024-02-26 ENCOUNTER — Other Ambulatory Visit: Payer: Self-pay | Admitting: Family Medicine

## 2024-02-26 DIAGNOSIS — Z1231 Encounter for screening mammogram for malignant neoplasm of breast: Secondary | ICD-10-CM

## 2024-03-03 ENCOUNTER — Emergency Department (HOSPITAL_COMMUNITY)
Admission: EM | Admit: 2024-03-03 | Discharge: 2024-03-03 | Disposition: A | Discharge status: Home / Self Care | Attending: Emergency Medicine | Admitting: Emergency Medicine

## 2024-03-03 ENCOUNTER — Emergency Department (HOSPITAL_COMMUNITY)

## 2024-03-03 ENCOUNTER — Other Ambulatory Visit: Payer: Self-pay

## 2024-03-03 ENCOUNTER — Encounter (HOSPITAL_COMMUNITY): Payer: Self-pay

## 2024-03-03 DIAGNOSIS — R42 Dizziness and giddiness: Secondary | ICD-10-CM | POA: Insufficient documentation

## 2024-03-03 DIAGNOSIS — I482 Chronic atrial fibrillation, unspecified: Secondary | ICD-10-CM | POA: Insufficient documentation

## 2024-03-03 DIAGNOSIS — Z7901 Long term (current) use of anticoagulants: Secondary | ICD-10-CM | POA: Insufficient documentation

## 2024-03-03 LAB — COMPREHENSIVE METABOLIC PANEL WITH GFR
ALT: 12 U/L (ref 0–44)
AST: 17 U/L (ref 15–41)
Albumin: 3.7 g/dL (ref 3.5–5.0)
Alkaline Phosphatase: 41 U/L (ref 38–126)
Anion gap: 12 (ref 5–15)
BUN: 23 mg/dL (ref 8–23)
CO2: 25 mmol/L (ref 22–32)
Calcium: 9.4 mg/dL (ref 8.9–10.3)
Chloride: 102 mmol/L (ref 98–111)
Creatinine, Ser: 1.29 mg/dL — ABNORMAL HIGH (ref 0.44–1.00)
GFR, Estimated: 43 mL/min — ABNORMAL LOW (ref >60)
Glucose, Bld: 93 mg/dL (ref 70–99)
Potassium: 3.8 mmol/L (ref 3.5–5.1)
Sodium: 139 mmol/L (ref 135–145)
Total Bilirubin: 1.2 mg/dL (ref 0.0–1.2)
Total Protein: 7.4 g/dL (ref 6.5–8.1)

## 2024-03-03 LAB — URINALYSIS, ROUTINE W REFLEX MICROSCOPIC
Bilirubin Urine: NEGATIVE
Glucose, UA: NEGATIVE mg/dL
Hgb urine dipstick: NEGATIVE
Ketones, ur: NEGATIVE mg/dL
Leukocytes,Ua: NEGATIVE
Nitrite: NEGATIVE
Protein, ur: NEGATIVE mg/dL
Specific Gravity, Urine: 1.004 — ABNORMAL LOW (ref 1.005–1.030)
pH: 5 (ref 5.0–8.0)

## 2024-03-03 LAB — CBC
HCT: 43 % (ref 36.0–46.0)
Hemoglobin: 14 g/dL (ref 12.0–15.0)
MCH: 29.9 pg (ref 26.0–34.0)
MCHC: 32.6 g/dL (ref 30.0–36.0)
MCV: 91.9 fL (ref 80.0–100.0)
Platelets: 278 K/uL (ref 150–400)
RBC: 4.68 MIL/uL (ref 3.87–5.11)
RDW: 14.6 % (ref 11.5–15.5)
WBC: 6.6 K/uL (ref 4.0–10.5)
nRBC: 0 % (ref 0.0–0.2)

## 2024-03-03 LAB — CBG MONITORING, ED: Glucose-Capillary: 96 mg/dL (ref 70–99)

## 2024-03-03 MED ORDER — MECLIZINE HCL 25 MG PO TABS: 25.0000 mg | ORAL_TABLET | Freq: Three times a day (TID) | ORAL | 0 refills | Status: AC | PRN

## 2024-03-03 MED ORDER — MECLIZINE HCL 25 MG PO TABS
25.0000 mg | ORAL_TABLET | Freq: Once | ORAL | Status: AC
  Administered 2024-03-03: 25 mg via ORAL
  Filled 2024-03-03: qty 1

## 2024-03-03 MED ORDER — SODIUM CHLORIDE 0.9 % IV BOLUS
500.0000 mL | Freq: Once | INTRAVENOUS | Status: AC
  Administered 2024-03-03: 500 mL via INTRAVENOUS

## 2024-03-03 NOTE — ED Triage Notes (Signed)
 Pt arrives via POV. PT reports she has been experiencing intermittent dizziness for the past two days. She denies any other associated symptoms. States her BP has been elevated as well. Pt is AxOx4.

## 2024-03-03 NOTE — Discharge Instructions (Signed)
 It was a pleasure taking care of you here today  Your workup today was reassuring.  Your symptoms improved with the medication meclizine .  I have sent some of this into the pharmacy.  Make sure to rest, drink plenty of fluids.  Take the medication as prescribed  Make sure to follow-up outpatient with primary care provider in the next few days, return for any new or worsening symptoms

## 2024-03-03 NOTE — ED Provider Notes (Signed)
 [facility] Provider Note   CSN: [id number] Arrival date & time: 03/03/24  1327    Patient presents with: Dizziness and Hypertension   Martha Kelly is a 78 y.o. female here for evaluation of intermittent dizziness over the last 2 days.  States when she woke up yesterday she just felt off worse with movement.  Denies sensation of room spinning.  No vision changes, headache, numbness, weakness, chest pain, shortness of breath, nausea, vomiting.  No pain or swelling to extremities.  History of A-fib, chronically anticoagulated.  Did take her blood pressure noted to be elevated.  Has ILR which has since reached end-of-life and did not have her placed for her A-fib.  She denies any recent falls or injuries.  No recent illnesses.  History of craniectomy for Chiari malformation.  Does admit that she has been eating less than normal due to trying to lose weight.   HPI     Prior to Admission medications   Medication Sig Start Date End Date Taking? Authorizing Provider  meclizine  (ANTIVERT ) 25 MG tablet Take 1 tablet (25 mg total) by mouth 3 (three) times daily as needed for dizziness. 03/03/24  Yes Omah Dewalt A, PA-C  acetaminophen  (TYLENOL ) 500 MG tablet Take 500 mg by mouth every 6 (six) hours as needed for mild pain or moderate pain.    [provider]  apixaban  (ELIQUIS ) 5 MG TABS tablet Take 1 tablet (5 mg total) by mouth 2 (two) times daily. 02/08/24   Mercer Clotilda SAUNDERS, MD  Cholecalciferol  (VITAMIN D3) 50 MCG (2000 UT) capsule Take 2,000 Units by mouth daily.    [provider]  diclofenac Sodium (VOLTAREN) 1 % GEL APPLY TO AFFECTED AREA FOUR TIMES DAILY AS NEEDED FOR SHOULDER OSTEOARTHRITIS. 08/12/19   [provider]  hydrochlorothiazide (HYDRODIURIL) 25 MG tablet Take 25 mg by mouth daily. 01/14/19   [provider]  HYDROcodone-acetaminophen  (NORCO/VICODIN) 5-325 MG tablet Take 1 tablet by mouth every  4 (four) hours as needed. Patient not taking: Reported on 02/16/2024 03/19/20   [provider]  lisinopril  (ZESTRIL ) 40 MG tablet Take 1 tablet by mouth daily. 07/20/20   [provider]  REPATHA  SURECLICK 140 MG/ML SOAJ SMARTSIG:140 Milligram(s) SUB-Q Every 2 Weeks    [provider]    Allergies: Statins    Review of Systems  Constitutional: Negative.   HENT: Negative.    Respiratory: Negative.    Cardiovascular: Negative.   Gastrointestinal: Negative.   Genitourinary: Negative.   Musculoskeletal: Negative.   Skin: Negative.   Neurological:  Positive for dizziness. Negative for tremors, seizures, syncope, facial asymmetry, speech difficulty, weakness, light-headedness, numbness and headaches.  All other systems reviewed and are negative.   Updated Vital Signs BP (!) 142/74   Pulse 67   Temp 98.6 F (37 C)   Resp 13   SpO2 98%   Physical Exam Physical Exam  Constitutional: Pt is oriented to person, place, and time. Pt appears well-developed and well-nourished. No distress.  HENT:  Head: Normocephalic and atraumatic.  Mouth/Throat: Oropharynx is clear and moist.  Eyes: Conjunctivae and EOM are normal. Pupils are equal, round, and reactive to light. No scleral icterus.  No horizontal, vertical or rotational nystagmus  Neck: Normal range of motion. Neck supple.  Full active and passive ROM without pain No midline or paraspinal tenderness No nuchal rigidity or meningeal signs  Cardiovascular: Normal rate, regular rhythm and intact distal pulses.   Pulmonary/Chest: Effort normal  and breath sounds normal. No respiratory distress. Pt has no wheezes. No rales.  Abdominal: Soft. Bowel sounds are normal. There is no tenderness. There is no rebound and no guarding.  Musculoskeletal: Normal range of motion.  Lymphadenopathy:    No cervical adenopathy.  Neurological: Pt. is alert and oriented to person, place, and time. He has normal reflexes. No cranial  nerve deficit.  Exhibits normal muscle tone. Coordination normal.  Mental Status:  Alert, oriented, thought content appropriate. Speech fluent without evidence of aphasia. Able to follow 2 step commands without difficulty.  Cranial Nerves:  2-12 grossly intact Motor:  5/5 in upper and lower extremities bilaterally including strong and equal grip strength and dorsiflexion/plantar flexion Sensory: Intact sensation to BLU and BLE  Deep Tendon Reflexes: 2+ and symmetric  Cerebellar: normal F2N with bilateral upper extremities Gait: normal gait and balance CV: distal pulses palpable throughout   Skin: Skin is warm and dry. No rash noted. Pt is not diaphoretic.  Psychiatric: Pt has a normal mood and affect. Behavior is normal. Judgment and thought content normal.  Nursing note and vitals reviewed.  (all labs ordered are listed, but only abnormal results are displayed) Labs Reviewed  COMPREHENSIVE METABOLIC PANEL WITH GFR - Abnormal; Notable for the following components:      Result Value   Creatinine, Ser 1.29 (*)    GFR, Estimated 43 (*)    All other components within normal limits  URINALYSIS, ROUTINE W REFLEX MICROSCOPIC - Abnormal; Notable for the following components:   Color, Urine STRAW (*)    Specific Gravity, Urine 1.004 (*)    All other components within normal limits  CBC  CBG MONITORING, ED    EKG: None  Radiology: CT Head Wo Contrast Result Date: 03/03/2024 CLINICAL DATA:  Vertigo, central. EXAM: CT HEAD WITHOUT CONTRAST TECHNIQUE: Contiguous axial images were obtained from the base of the skull through the vertex without intravenous contrast. RADIATION DOSE REDUCTION: This exam was performed according to the departmental dose-optimization program which includes automated exposure control, adjustment of the mA and/or kV according to patient size and/or use of iterative reconstruction technique. COMPARISON:  Head CT and MRI 03/14/2019 FINDINGS: Brain: There is no evidence of  an acute infarct, intracranial hemorrhage, mass, midline shift, or extra-axial fluid collection. Cerebral volume is normal. The ventricles are normal in size. Patchy cerebral white matter hypodensities are similar to the prior CT and are nonspecific but compatible with mild chronic small vessel ischemic disease. Encephalomalacia is again noted inferiorly in the left greater than right cerebellar hemispheres subjacent to a craniectomy. Vascular: Calcified atherosclerosis at the skull base. No hyperdense vessel. Skull: No acute fracture or suspicious lesion. Suboccipital craniectomy and C1 laminectomy. Sinuses/Orbits: Paranasal sinuses and mastoid air cells are clear. Unremarkable orbits. Other: None. IMPRESSION: 1. No evidence of acute intracranial abnormality. 2. 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 Electronically Signed   By: Dasie Hamburg M.D.   On: 03/03/2024 16:41     Procedures   Medications Ordered in the ED  sodium chloride  0.9 % bolus 500 mL (has no administration in time range)  meclizine  (ANTIVERT ) tablet 25 mg (25 mg Oral Given 03/03/24 5054)   78 year old here for evaluation of dizziness.  Has a difficult time describing this.  Started yesterday when she woke up.  Intermittent, worse with movement.  No syncope.  No sudden onset thunderclap headache.  She is a nonfocal neuroexam.  No chest pain, shortness of breath abdominal pain, nausea  and vomiting.  Will plan on labs, CT scan, EKG, fluid bolus, meclizine  and reassess  Per chart review she has ILR for A-fib, chronically anticoagulated.  History of Afib on DOAC> reach EOL  Labs and imaging personally viewed and interpreted:  EKG without ischemic changes UA negative for infection, blood CBC without leukocytosis Metabolic panel creatinine 1.29, similar to prior CT head without significant acute findings  Patient reassessed.  Got IV fluids, meclizine .  Significant improvement in symptoms  which are now resolved.  She is ambulated twice here in the emergency department with no further dizziness.  I suspect likely peripheral vertigo.    Considered central vertigo/CVA, dissection, infectious process, electrolyte abnormality, ACS, PE, seizure-like activity, shock, orthostatic hypotension, arrhythmia however given history, exam, labs, imaging and improvement in symptoms with meclizine  I have low suspicion for above.  Will have her follow-up outpatient, return for new or worsening symptoms  The patient has been appropriately medically screened and/or stabilized in the ED. I have low suspicion for any other emergent medical condition which would require further screening, evaluation or treatment in the ED or require inpatient management.  Patient is hemodynamically stable and in no acute distress.  Patient able to ambulate in department prior to ED.  Evaluation does not show acute pathology that would require ongoing or additional emergent interventions while in the emergency department or further inpatient treatment.  I have discussed the diagnosis with the patient and answered all questions.  Pain is been managed while in the emergency department and patient has no further complaints prior to discharge.  Patient is comfortable with plan discussed in room and is stable for discharge at this time.  I have discussed strict return precautions for returning to the emergency department.  Patient was encouraged to follow-up with PCP/specialist refer to at discharge.                                    Medical Decision Making Amount and/or Complexity of Data Reviewed Independent Historian:     Details: Family in room External Data Reviewed: labs, radiology, ECG and notes. Labs: ordered. Decision-making details documented in ED Course. Radiology: ordered and independent interpretation performed. Decision-making details documented in ED Course. ECG/medicine tests: ordered and independent  interpretation performed. Decision-making details documented in ED Course.  Risk OTC drugs. Prescription drug management. Decision regarding hospitalization. Diagnosis or treatment significantly limited by social determinants of health.        Final diagnoses:  Dizziness    ED Discharge Orders          Ordered    meclizine  (ANTIVERT ) 25 MG tablet  3 times daily PRN        03/03/24 1747               Anatasia Tino A, PA-C 03/03/24 1749    Tegeler, Lonni PARAS, MD 03/03/24 2311

## 2024-03-04 ENCOUNTER — Telehealth: Payer: Self-pay

## 2024-03-04 NOTE — Telephone Encounter (Signed)
 Patient is calling to sch an appt to be seen dizzness Bp is elevated 150s /91-115, patient is sch 9/25

## 2024-03-04 NOTE — Telephone Encounter (Signed)
 Copied from CRM #[id number]. Topic: Clinical - Lab/Test Results >> Mar 04, 2024 12:26 PM Mesmerise C wrote: Reason for CRM: Patient was in the ED and got her lab reports and some came back abnormal and stated she would like to discuss with Dr.Banks about the reports would like a call back to discuss

## 2024-03-07 ENCOUNTER — Ambulatory Visit: Admitting: Family Medicine

## 2024-03-07 ENCOUNTER — Encounter: Payer: Self-pay | Admitting: Family Medicine

## 2024-03-07 VITALS — BP 118/80 | HR 73 | Temp 98.4°F | Ht 63.66 in | Wt 229.8 lb

## 2024-03-07 DIAGNOSIS — G9331 Postviral fatigue syndrome: Secondary | ICD-10-CM

## 2024-03-07 DIAGNOSIS — Z789 Other specified health status: Secondary | ICD-10-CM

## 2024-03-07 DIAGNOSIS — Z87898 Personal history of other specified conditions: Secondary | ICD-10-CM | POA: Diagnosis not present

## 2024-03-07 DIAGNOSIS — M150 Primary generalized (osteo)arthritis: Secondary | ICD-10-CM

## 2024-03-07 DIAGNOSIS — E785 Hyperlipidemia, unspecified: Secondary | ICD-10-CM

## 2024-03-07 DIAGNOSIS — N1832 Chronic kidney disease, stage 3b: Secondary | ICD-10-CM | POA: Diagnosis not present

## 2024-03-07 DIAGNOSIS — R42 Dizziness and giddiness: Secondary | ICD-10-CM | POA: Diagnosis not present

## 2024-03-07 LAB — LIPID PANEL
Cholesterol: 170 mg/dL (ref 0–200)
HDL: 49.2 mg/dL (ref >39.00)
LDL Cholesterol: 103 mg/dL — ABNORMAL HIGH (ref 0–99)
NonHDL: 120.95
Total CHOL/HDL Ratio: 3
Triglycerides: 90 mg/dL (ref 0.0–149.0)
VLDL: 18 mg/dL (ref 0.0–40.0)

## 2024-03-07 LAB — BASIC METABOLIC PANEL WITH GFR
BUN: 30 mg/dL — ABNORMAL HIGH (ref 6–23)
CO2: 28 meq/L (ref 19–32)
Calcium: 9.7 mg/dL (ref 8.4–10.5)
Chloride: 101 meq/L (ref 96–112)
Creatinine, Ser: 1.2 mg/dL (ref 0.40–1.20)
GFR: 44.14 mL/min — ABNORMAL LOW (ref >60.00)
Glucose, Bld: 102 mg/dL — ABNORMAL HIGH (ref 70–99)
Potassium: 3.8 meq/L (ref 3.5–5.1)
Sodium: 140 meq/L (ref 135–145)

## 2024-03-07 LAB — VITAMIN D 25 HYDROXY (VIT D DEFICIENCY, FRACTURES): VITD: 70.93 ng/mL (ref 30.00–100.00)

## 2024-03-07 LAB — HEMOGLOBIN A1C: Hgb A1c MFr Bld: 6.7 % — ABNORMAL HIGH (ref 4.6–6.5)

## 2024-03-07 LAB — GLUCOSE, POCT (MANUAL RESULT ENTRY): POC Glucose: 80 mg/dL (ref 70–99)

## 2024-03-07 MED ORDER — BLOOD GLUCOSE TEST VI STRP: 1.0000 | ORAL_STRIP | Freq: Three times a day (TID) | 11 refills | Status: AC

## 2024-03-07 MED ORDER — REPATHA SURECLICK 140 MG/ML ~~LOC~~ SOAJ: 140.0000 mg | SUBCUTANEOUS | 11 refills | Status: AC

## 2024-03-07 MED ORDER — LANCET DEVICE MISC: 1.0000 | Freq: Three times a day (TID) | 0 refills | Status: AC

## 2024-03-07 MED ORDER — LANCETS MISC. MISC: 1.0000 | Freq: Three times a day (TID) | 11 refills | Status: AC

## 2024-03-07 MED ORDER — BLOOD GLUCOSE MONITORING SUPPL DEVI
1.0000 | Freq: Three times a day (TID) | 0 refills | Status: AC
Start: 2024-03-07 — End: ?

## 2024-03-07 NOTE — Patient Instructions (Signed)
 I placed orders for labs to check your A1c, cholesterol, and recheck your kidney function.  If needed after repeat labs we will place a referral for the nephrologist.  It appears that a referral to the cardiologist was placed at last [facility] and approved.  Unsure if they have contacted you regarding getting an appointment.

## 2024-03-07 NOTE — Progress Notes (Signed)
 Established Patient Office Visit   Subjective  Patient ID: Martha Kelly, female    DOB: Jan 15, 1946  Age: 78 y.o. MRN: [id number]  Chief Complaint  Patient presents with   Hospitalization Follow-up    Pt is a 78 year old female who presents with fatigue and dizziness post-COVID. She is accompanied by her daughter, Crystal.  Pt with persistent fatigue and decreased energy levels since recovering from COVID-19.   Appetite has decreased, and she has been intentionally reducing her food intake. Despite recent efforts to eat healthier meals, such as baked tilapia and vegetables, she previously consumed fast food occasionally.  Pt seen in the ED on 03/03/24 after an episode of dizziness and wooziness x 2 days.  Pt's daughter checked her blood pressure and blood sugar. Her blood pressure was noted to be 155/unknown on Sunday and 118/unknown on the day of the visit. Her blood sugar was noted to be 130, higher than her usual levels, despite recent dietary changes. She was previously on prediabetic medications but discontinued them in May.  She reports darker urine despite drinking three 20-ounce bottles of water daily and sometimes drinking more. She also drinks chamomile tea twice a day and occasionally has coffee.   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  She has been experiencing arthritis in one of her knees since receiving injections earlier in the year, for which she has been taking extra strength Tylenol . She has not taken Tylenol  since last Friday and reports improvement in her knee pain. She has been trying to increase her physical activity but acknowledges she does not move enough.  She has been consuming red grapefruit regularly, which she read might interact with her medications. She is concerned about the potential impact of her diet and medications on her blood pressure and blood sugar levels. Her blood sugar readings  have been fluctuating, with a reading of 136 in the morning and 124 the previous night, despite not eating since 5 PM the day before.    Patient Active Problem List   Diagnosis Date Noted   Statin intolerance 02/02/2024   Implantable loop recorder present 02/02/2024   History of stroke 02/02/2024   Memory change 02/02/2024   Primary osteoarthritis involving multiple joints 02/02/2024   Paroxysmal atrial fibrillation (HCC) 08/25/2020   Secondary hypercoagulable state 08/25/2020   PFO (patent foramen ovale) 03/15/2019   Essential hypertension 03/14/2019   HLD (hyperlipidemia) 03/14/2019   Cryptogenic stroke (HCC) 03/14/2019   Past Medical History:  Diagnosis Date   Hypertension    Prediabetes    Past Surgical History:  Procedure Laterality Date   ABDOMINAL HYSTERECTOMY     BUBBLE STUDY  03/15/2019   Procedure: BUBBLE STUDY;  Surgeon: Pietro Redell RAMAN, MD;  Location: [facility] ENDOSCOPY;  Service: Cardiovascular;;   LOOP RECORDER INSERTION N/A 03/15/2019   Procedure: LOOP RECORDER INSERTION;  Surgeon: Inocencio Soyla Lunger, MD;  Location: [facility] INVASIVE CV LAB;  Service: Cardiovascular;  Laterality: N/A;   NECK SURGERY     TEE WITHOUT CARDIOVERSION N/A 03/15/2019   Procedure: TRANSESOPHAGEAL ECHOCARDIOGRAM (TEE);  Surgeon: Pietro Redell RAMAN, MD;  Location: [facility] ENDOSCOPY;  Service: Cardiovascular;  Laterality: N/A;   Social History   Tobacco Use   Smoking status: Never   Smokeless tobacco: Never  Substance Use Topics   Alcohol use: Never   Drug use: Never   Family History  Problem Relation Age of Onset   CAD Mother  CAD Father    Diabetes Mellitus II Maternal Uncle    Allergies  Allergen Reactions   Statins Other (See Comments)    arthralgias    ROS Negative unless stated above    Objective:     BP 118/80 (BP Location: Left Arm, Patient Position: Sitting, Cuff Size: Large)   Pulse 73   Temp 98.4 F (36.9 C) (Oral)   Ht 5' 3.66 (1.617 m)   Wt 229 lb 12.8 oz (104.2  kg)   SpO2 96%   BMI 39.87 kg/m  BP Readings from Last 3 Encounters:  03/07/24 118/80  03/03/24 (!) 141/70  02/02/24 132/80   Wt Readings from Last 3 Encounters:  03/07/24 229 lb 12.8 oz (104.2 kg)  02/02/24 233 lb 12.8 oz (106.1 kg)  08/25/20 239 lb 12.8 oz (108.8 kg)      Physical Exam     02/02/2024   10:24 AM  Depression screen PHQ 2/9  Decreased Interest 0  Down, Depressed, Hopeless 0  PHQ - 2 Score 0  Altered sleeping 0  Tired, decreased energy 0  Change in appetite 0  Feeling bad or failure about yourself  0  Trouble concentrating 0  Moving slowly or fidgety/restless 0  Suicidal thoughts 0  PHQ-9 Score 0      02/02/2024   10:24 AM  GAD 7 : Generalized Anxiety Score  Nervous, Anxious, on Edge 0  Control/stop worrying 0  Worry too much - different things 0  Trouble relaxing 0  Restless 0  Easily annoyed or irritable 0  Afraid - awful might happen 0  Total GAD 7 Score 0     No results found for any visits on 03/07/24.    Assessment & Plan:   Dizziness -     Hemoglobin A1c; Future -     POCT glucose (manual entry) -     Basic metabolic panel with GFR; Future  Postviral fatigue syndrome -     VITAMIN D  25 Hydroxy (Vit-D Deficiency, Fractures); Future  Stage 3b chronic kidney disease (HCC) -     Basic metabolic panel with GFR; Future  Primary osteoarthritis involving multiple joints  Hyperlipidemia, unspecified hyperlipidemia type -     Repatha  SureClick; Inject 140 mg into the skin every 14 (fourteen) days.  Dispense: 2 mL; Refill: 11 -     Lipid panel; Future  Statin intolerance  History of prediabetes -     Hemoglobin A1c; Future -     POCT glucose (manual entry) -     Blood Glucose Monitoring Suppl; 1 each by Does not apply route in the morning, at noon, and at bedtime. May substitute to any manufacturer covered by patient's insurance.  Dispense: 1 each; Refill: 0 -     Blood Glucose Test; 1 each by Does not apply route in the morning,  at noon, and at bedtime. May substitute to any manufacturer covered by patient's insurance.  Dispense: 100 strip; Refill: 11 -     Lancet Device; 1 each by Does not apply route in the morning, at noon, and at bedtime. May substitute to any manufacturer covered by patient's insurance.  Dispense: 1 each; Refill: 0 -     Lancets Misc.; 1 each by Does not apply route in the morning, at noon, and at bedtime. May substitute to any manufacturer covered by patient's insurance.  Dispense: 100 each; Refill: 11  Dizziness likely 2/2 vertigo as occurred upon waking up in am and symptoms improved in ED with  meclizine .  CT head negative in ED.  Discussed preventative care and taking time with switching positions.  Continue meclizine  as needed.  Also discussed importance of hydration.  Discussed checking vitamin D  level for history of fatigue status post COVID infection.  Also consider other vitamin or electrolyte abnormalities contributing to symptoms.  Recheck renal function.  Creatinine 1.29 in ED, near baseline.  Referral to [facility]rology if needed.  OA of multiple sites.  Discussed supportive care including topical analgesics, Tylenol  given Eliquis  use.  Follow-up with Ortho.  History of hyperlipidemia with statin intolerance.  Continue lifestyle modifications. Continue repatha .  H/o preDM.  Check A1C.  Glucometer supplies refilled.  Lifestyle modifications encouraged.  Consider medication if needed based on results.  Return if symptoms worsen or fail to improve.   Clotilda JONELLE Single, MD

## 2024-03-08 ENCOUNTER — Ambulatory Visit (INDEPENDENT_AMBULATORY_CARE_PROVIDER_SITE_OTHER)

## 2024-03-08 VITALS — Ht 63.6 in | Wt 229.0 lb

## 2024-03-08 DIAGNOSIS — Z0000 Encounter for general adult medical examination without abnormal findings: Secondary | ICD-10-CM | POA: Diagnosis not present

## 2024-03-08 NOTE — Progress Notes (Signed)
 Subjective:   Martha Kelly is a 78 y.o. who presents for a Medicare Wellness preventive visit.  As a reminder, Annual Wellness Visits don't include a physical exam, and some assessments may be limited, especially if this visit is performed virtually. We may recommend an in-person follow-up visit with your provider if needed.  Visit Complete: Virtual I connected with  Martha Kelly on 03/08/24 by a audio enabled telemedicine application and verified that I am speaking with the correct person using two identifiers.  Patient Location: Home  Provider Location: Home Office  I discussed the limitations of evaluation and management by telemedicine. The patient expressed understanding and agreed to proceed.  Vital Signs: Because this visit was a virtual/telehealth visit, some criteria may be missing or patient reported. Any vitals not documented were not able to be obtained and vitals that have been documented are patient reported.    Persons Participating in Visit: Patient.  AWV Questionnaire: No: Patient Medicare AWV questionnaire was not completed prior to this visit.  Cardiac Risk Factors include: advanced age (>52men, >58 women);hypertension     Objective:    Today's Vitals   03/08/24 1118 03/08/24 1119  Weight: 229 lb (103.9 kg)   Height: 5' 3.6 (1.615 m)   PainSc:  0-No pain   Body mass index is 39.8 kg/m.     03/08/2024   11:26 AM 03/03/2024    1:47 PM 03/14/2019    2:50 AM  Advanced Directives  Does Patient Have a Medical Advance Directive? No Yes No  Type of Advance Directive  Living will   Would patient like information on creating a medical advance directive? No - Patient declined  No - Patient declined    Current Medications (verified) Outpatient Encounter Medications as of 03/08/2024  Medication Sig   acetaminophen  (TYLENOL ) 500 MG tablet Take 500 mg by mouth every 6 (six) hours as needed for mild pain or moderate pain.   apixaban  (ELIQUIS ) 5 MG  TABS tablet Take 1 tablet (5 mg total) by mouth 2 (two) times daily.   Blood Glucose Monitoring Suppl DEVI 1 each by Does not apply route in the morning, at noon, and at bedtime. May substitute to any manufacturer covered by patient's insurance.   Cholecalciferol  (VITAMIN D3) 50 MCG (2000 UT) capsule Take 2,000 Units by mouth daily.   diclofenac Sodium (VOLTAREN) 1 % GEL APPLY TO AFFECTED AREA FOUR TIMES DAILY AS NEEDED FOR SHOULDER OSTEOARTHRITIS.   Glucose Blood (BLOOD GLUCOSE TEST STRIPS) STRP 1 each by Does not apply route in the morning, at noon, and at bedtime. May substitute to any manufacturer covered by patient's insurance.   hydrochlorothiazide (HYDRODIURIL) 25 MG tablet Take 25 mg by mouth daily.   HYDROcodone-acetaminophen  (NORCO/VICODIN) 5-325 MG tablet Take 1 tablet by mouth every 4 (four) hours as needed.   Lancet Device MISC 1 each by Does not apply route in the morning, at noon, and at bedtime. May substitute to any manufacturer covered by patient's insurance.   Lancets Misc. MISC 1 each by Does not apply route in the morning, at noon, and at bedtime. May substitute to any manufacturer covered by patient's insurance.   lisinopril  (ZESTRIL ) 40 MG tablet Take 1 tablet by mouth daily.   meclizine  (ANTIVERT ) 25 MG tablet Take 1 tablet (25 mg total) by mouth 3 (three) times daily as needed for dizziness.   REPATHA  SURECLICK 140 MG/ML SOAJ Inject 140 mg into the skin every 14 (fourteen) days.   No facility-administered encounter medications on  file as of 03/08/2024.    Allergies (verified) Statins   History: Past Medical History:  Diagnosis Date   Hypertension    Prediabetes    Past Surgical History:  Procedure Laterality Date   ABDOMINAL HYSTERECTOMY     BUBBLE STUDY  03/15/2019   Procedure: BUBBLE STUDY;  Surgeon: Pietro Redell RAMAN, MD;  Location: [facility] ENDOSCOPY;  Service: Cardiovascular;;   LOOP RECORDER INSERTION N/A 03/15/2019   Procedure: LOOP RECORDER INSERTION;  Surgeon:  Inocencio Soyla Lunger, MD;  Location: [facility] INVASIVE CV LAB;  Service: Cardiovascular;  Laterality: N/A;   NECK SURGERY     TEE WITHOUT CARDIOVERSION N/A 03/15/2019   Procedure: TRANSESOPHAGEAL ECHOCARDIOGRAM (TEE);  Surgeon: Pietro Redell RAMAN, MD;  Location: [facility] ENDOSCOPY;  Service: Cardiovascular;  Laterality: N/A;   Family History  Problem Relation Age of Onset   CAD Mother    CAD Father    Diabetes Mellitus II Maternal Uncle    Social History   Socioeconomic History   Marital status: Married    Spouse name: Not on file   Number of children: Not on file   Years of education: Not on file   Highest education level: Not on file  Occupational History   Not on file  Tobacco Use   Smoking status: Never   Smokeless tobacco: Never  Substance and Sexual Activity   Alcohol use: Never   Drug use: Never   Sexual activity: Not on file  Other Topics Concern   Not on file  Social History Narrative   Not on file   Social Drivers of Health   Financial Resource Strain: Low Risk  (03/08/2024)   Overall Financial Resource Strain (CARDIA)    Difficulty of Paying Living Expenses: Not hard at all  Food Insecurity: No Food Insecurity (03/08/2024)   Hunger Vital Sign    Worried About Running Out of Food in the Last Year: Never true    Ran Out of Food in the Last Year: Never true  Transportation Needs: No Transportation Needs (03/08/2024)   PRAPARE - Administrator, Civil Service (Medical): No    Lack of Transportation (Non-Medical): No  Physical Activity: Inactive (03/08/2024)   Exercise Vital Sign    Days of Exercise per Week: 0 days    Minutes of Exercise per Session: 0 min  Stress: No Stress Concern Present (03/08/2024)   [employer] of Occupational Health - Occupational Stress Questionnaire    Feeling of Stress: Not at all  Social Connections: Moderately Integrated (03/08/2024)   Social Connection and Isolation Panel    Frequency of Communication with Friends and Family:  More than three times a week    Frequency of Social Gatherings with Friends and Family: More than three times a week    Attends Religious Services: More than 4 times per year    Active Member of [employer] or Organizations: Yes    Attends Engineer, structural: More than 4 times per year    Marital Status: Separated    Tobacco Counseling Counseling given: Not Answered    Clinical Intake:  Pre-visit preparation completed: Yes  Pain : No/denies pain Pain Score: 0-No pain     BMI - recorded: 39.8 Nutritional Status: BMI > 30  Obese Nutritional Risks: None Diabetes: Yes CBG done?: Yes CBG resulted in Enter/ Edit results?: Yes (CBG 115 Per patient) Did pt. bring in CBG monitor from home?: No  Lab Results  Component Value Date   HGBA1C 6.7 (H) 03/07/2024  HGBA1C 6.4 (H) 03/14/2019     How often do you need to have someone help you when you read instructions, pamphlets, or other written materials from your doctor or pharmacy?: 1 - Never  Interpreter Needed?: No  Information entered by :: Martha Blush LPN   Activities of Daily Living     03/08/2024   11:25 AM  In your present state of health, do you have any difficulty performing the following activities:  Hearing? 0  Vision? 0  Difficulty concentrating or making decisions? 0  Walking or climbing stairs? 0  Dressing or bathing? 0  Doing errands, shopping? 0  Preparing Food and eating ? N  Using the Toilet? N  In the past six months, have you accidently leaked urine? N  Do you have problems with loss of bowel control? N  Managing your Medications? N  Managing your Finances? N  Housekeeping or managing your Housekeeping? N    Patient Care Team: Mercer Clotilda SAUNDERS, MD as PCP - General (Family Medicine)  I have updated your Care Teams any recent Medical Services you may have received from other providers in the past year.     Assessment:   This is a routine wellness examination for  Martha Kelly.  Hearing/Vision screen Hearing Screening - Comments:: Denies hearing difficulties   Vision Screening - Comments:: Wears rx glasses - up to date with [facility] exams with  [facility]   Goals Addressed               This Visit's Progress     Increase physical activity (pt-stated)        Get more active       Depression Screen     03/08/2024   11:25 AM 03/07/2024   10:08 AM 02/02/2024   10:24 AM  PHQ 2/9 Scores  PHQ - 2 Score 0 0 0  PHQ- 9 Score 0 1 0    Fall Risk     03/08/2024   11:26 AM 03/07/2024   10:08 AM 02/02/2024   10:24 AM  Fall Risk   Falls in the past year? 0 0 0  Number falls in past yr: 0 0 0  Injury with Fall? 0 0 0  Risk for fall due to : No Fall Risks No Fall Risks No Fall Risks  Follow up Falls evaluation completed Falls evaluation completed     MEDICARE RISK AT HOME:  Medicare Risk at Home Any stairs in or around the home?: No If so, are there any without handrails?: No Home free of loose throw rugs in walkways, pet beds, electrical cords, etc?: Yes Adequate lighting in your home to reduce risk of falls?: Yes Life alert?: No Use of a cane, walker or w/c?: No Grab bars in the bathroom?: No Shower chair or bench in shower?: No Elevated toilet seat or a handicapped toilet?: No  TIMED UP AND GO:  Was the test performed?  No  Cognitive Function: 6CIT completed        03/08/2024   11:27 AM  6CIT Screen  What Year? 0 points  What month? 0 points  What time? 0 points  Count back from 20 0 points  Months in reverse 0 points  Repeat phrase 0 points  Total Score 0 points    Immunizations Immunization History  Administered Date(s) Administered   Moderna Covid-19 Fall Seasonal Vaccine 61yrs & older 03/24/2023   Moderna Covid-19 Vaccine Bivalent Booster 108yrs & up 03/09/2021   Moderna SARS-COV2  Booster Vaccination 03/09/2021   Moderna Sars-Covid-2 Vaccination 09/18/2020   PFIZER(Purple Top)SARS-COV-2 Vaccination  08/23/2019, 09/13/2019, 03/26/2020   Pfizer(Comirnaty)Fall Seasonal Vaccine 12 years and older 03/19/2020, 04/20/2022   Pneumococcal Conjugate-13 09/26/2013   Pneumococcal Polysaccharide-23 11/24/2016   Td 11/24/2016    Screening Tests Health Maintenance  Topic Date Due   DEXA SCAN  Never done   COVID-19 Vaccine (10 - 2024-25 season) 03/23/2024 (Originally 02/12/2024)   Zoster Vaccines- Shingrix (1 of 2) 06/06/2024 (Originally 03/20/1998)   Influenza Vaccine  09/10/2024 (Originally 01/12/2024)   Colonoscopy  02/01/2025 (Originally 03/20/1993)   Hepatitis C Screening  02/01/2025 (Originally 03/20/1966)   Medicare Annual Wellness (AWV)  03/08/2025   DTaP/Tdap/Td (2 - Tdap) 11/25/2026   Pneumococcal Vaccine: 50+ Years  Completed   HPV VACCINES  Aged Out   Meningococcal B Vaccine  Aged Out    Health Maintenance Items Addressed:   Additional Screening:  Vision Screening: Recommended annual ophthalmology exams for early detection of glaucoma and other disorders of the eye. Is the patient up to date with their annual eye exam?  Yes  Who is the provider or what is the name of the office in which the patient attends annual eye exams? [facility]  Dental Screening: Recommended annual dental exams for proper oral hygiene  Community Resource Referral / Chronic Care Management: CRR required this visit?  No   CCM required this visit?  No   Plan:    I have personally reviewed and noted the following in the patient's chart:   Medical and social history Use of alcohol, tobacco or illicit drugs  Current medications and supplements including opioid prescriptions. Patient is not currently taking opioid prescriptions. Functional ability and status Nutritional status Physical activity Advanced directives List of other physicians Hospitalizations, surgeries, and ER visits in previous 12 months Vitals Screenings to include cognitive, depression, and falls Referrals and appointments  In  addition, I have reviewed and discussed with patient certain preventive protocols, quality metrics, and best practice recommendations. A written personalized care plan for preventive services as well as general preventive health recommendations were provided to patient.   Martha LELON Blush, LPN   [id number]   After Visit Summary: (MyChart) Due to this being a telephonic visit, the after visit summary with patients personalized plan was offered to patient via MyChart   Notes: Nothing significant to report at this time.

## 2024-03-08 NOTE — Patient Instructions (Addendum)
 Martha Kelly,  Thank you for taking the time for your Medicare Wellness Visit. I appreciate your continued commitment to your health goals. Please review the care plan we discussed, and feel free to reach out if I can assist you further.  Medicare recommends these wellness visits once per year to help you and your care team stay ahead of potential health issues. These visits are designed to focus on prevention, allowing your provider to concentrate on managing your acute and chronic conditions during your regular appointments.  Please note that Annual Wellness Visits do not include a physical exam. Some assessments may be limited, especially if the visit was conducted virtually. If needed, we may recommend a separate in-person follow-up with your provider.  Ongoing Care Seeing your primary care provider every 3 to 6 months helps us  monitor your health and provide consistent, personalized care.   Referrals If a referral was made during today's visit and you haven't received any updates within two weeks, please contact the referred provider directly to check on the status.  Recommended Screenings:  Health Maintenance  Topic Date Due   DEXA scan (bone density measurement)  Never done   COVID-19 Vaccine (10 - 2024-25 season) 03/23/2024*   Zoster (Shingles) Vaccine (1 of 2) 06/06/2024*   Flu Shot  09/10/2024*   Colon Cancer Screening  02/01/2025*   Hepatitis C Screening  02/01/2025*   Medicare Annual Wellness Visit  03/08/2025   DTaP/Tdap/Td vaccine (2 - Tdap) 11/25/2026   Pneumococcal Vaccine for age over 51  Completed   HPV Vaccine  Aged Out   Meningitis B Vaccine  Aged Out  *Topic was postponed. The date shown is not the original due date.   Opioid Pain Medicine Management Opioids are powerful medicines that are used to treat moderate to severe pain. When used for short periods of time, they can help you to: Sleep better. Do better in physical or occupational therapy. Feel better in  the first few days after an injury. Recover from surgery. Opioids should be taken with the supervision of a trained health care provider. They should be taken for the shortest period of time possible. This is because opioids can be addictive, and the longer you take opioids, the greater your risk of addiction. This addiction can also be called opioid use disorder. What are the risks? Using opioid pain medicines for longer than 3 days increases your risk of side effects. Side effects include: Constipation. Nausea and vomiting. Breathing difficulties (respiratory depression). Drowsiness. Confusion. Opioid use disorder. Itching. Taking opioid pain medicine for a long period of time can affect your ability to do daily tasks. It also puts you at risk for: Motor vehicle crashes. Depression. Suicide. Heart attack. Overdose, which can be life-threatening. What is a pain treatment plan? A pain treatment plan is an agreement between you and your health care provider. Pain is unique to each person, and treatments vary depending on your condition. To manage your pain, you and your health care provider need to work together. To help you do this: Discuss the goals of your treatment, including how much pain you might expect to have and how you will manage the pain. Review the risks and benefits of taking opioid medicines. Remember that a good treatment plan uses more than one approach and minimizes the chance of side effects. Be honest about the amount of medicines you take and about any drug or alcohol use. Get pain medicine prescriptions from only one health care provider. Pain can be  managed with many types of alternative treatments. Ask your health care provider to refer you to one or more specialists who can help you manage pain through: Physical or occupational therapy. Counseling (cognitive behavioral therapy). Good nutrition. Biofeedback. Massage. Meditation. Non-opioid medicine. Following  a gentle exercise program. How to use opioid pain medicine Taking medicine Take your pain medicine exactly as told by your health care provider. Take it only when you need it. If your pain gets less severe, you may take less than your prescribed dose if your health care provider approves. If you are not having pain, do nottake pain medicine unless your health care provider tells you to take it. If your pain is severe, do nottry to treat it yourself by taking more pills than instructed on your prescription. Contact your health care provider for help. Write down the times when you take your pain medicine. It is easy to become confused while on pain medicine. Writing the time can help you avoid overdose. Take other over-the-counter or prescription medicines only as told by your health care provider. Keeping yourself and others safe  While you are taking opioid pain medicine: Do not drive, use machinery, or power tools. Do not sign legal documents. Do not drink alcohol. Do not take sleeping pills. Do not supervise children by yourself. Do not do activities that require climbing or being in high places. Do not go to a lake, river, ocean, spa, or swimming pool. Do not share your pain medicine with anyone. Keep pain medicine in a locked cabinet or in a secure area where pets and children cannot reach it. Stopping your use of opioids If you have been taking opioid medicine for more than a few weeks, you may need to slowly decrease (taper) how much you take until you stop completely. Tapering your use of opioids can decrease your risk of symptoms of withdrawal, such as: Pain and cramping in the abdomen. Nausea. Sweating. Sleepiness. Restlessness. Uncontrollable shaking (tremors). Cravings for the medicine. Do not attempt to taper your use of opioids on your own. Talk with your health care provider about how to do this. Your health care provider may prescribe a step-down schedule based on how  much medicine you are taking and how long you have been taking it. Getting rid of leftover pills Do not save any leftover pills. Get rid of leftover pills safely by: Taking the medicine to a prescription take-back program. This is usually offered by the county or law enforcement. Bringing them to a pharmacy that has a drug disposal container. Flushing them down the toilet. Check the label or package insert of your medicine to see whether this is safe to do. Throwing them out in the trash. Check the label or package insert of your medicine to see whether this is safe to do. If it is safe to throw it out, remove the medicine from the original container, put it into a sealable bag or container, and mix it with used coffee grounds, food scraps, dirt, or cat litter before putting it in the trash. Follow these instructions at home: Activity Do exercises as told by your health care provider. Avoid activities that make your pain worse. Return to your normal activities as told by your health care provider. Ask your health care provider what activities are safe for you. General instructions You may need to take these actions to prevent or treat constipation: Drink enough fluid to keep your urine pale yellow. Take over-the-counter or prescription medicines. Eat foods that  are high in fiber, such as beans, whole grains, and fresh fruits and vegetables. Limit foods that are high in fat and processed sugars, such as fried or sweet foods. Keep all follow-up visits. This is important. Where to find support If you have been taking opioids for a long time, you may benefit from receiving support for quitting from a local support group or counselor. Ask your health care provider for a referral to these resources in your area. Where to find more information Centers for Disease Control and Prevention (CDC): FootballExhibition.com.br [place] Food and Drug Administration (FDA): PumpkinSearch.com.ee Get help right away if: You may have  taken too much of an opioid (overdosed). Common symptoms of an overdose: Your breathing is slower or more shallow than normal. You have a very slow heartbeat (pulse). You have slurred speech. You have nausea and vomiting. Your pupils become very small. You have other potential symptoms: You are very confused. You faint or feel like you will faint. You have cold, clammy skin. You have blue lips or fingernails. You have thoughts of harming yourself or harming others. These symptoms may represent a serious problem that is an emergency. Do not wait to see if the symptoms will go away. Get medical help right away. Call your local emergency services (911 in the [place] Do not drive yourself to the hospital.  If you ever feel like you may hurt yourself or others, or have thoughts about taking your own life, get help right away. Go to your nearest emergency department or: Call your local emergency services (911 in the [place] Call the [facility] ((540) 448-1522 in the [place] Call a suicide crisis helpline, such as the National Suicide Prevention Lifeline at 956 185 9407 or 988 in the [place] This is open 24 hours a day in the [place] If you're a Veteran: Call 988 and press 1. This is open 24 hours a day. Text the [employer] at 9716390946. Summary Opioid medicines can help you manage moderate to severe pain for a short period of time. A pain treatment plan is an agreement between you and your health care provider. Discuss the goals of your treatment, including how much pain you might expect to have and how you will manage the pain. If you think that you or someone else may have taken too much of an opioid, get medical help right away. This information is not intended to replace advice given to you by your health care provider. Make sure you discuss any questions you have with your health care provider. Document Revised: 03/06/2023 Document Reviewed: 09/09/2020 Elsevier Patient  Education  2024 Elsevier Inc.    03/08/2024   11:26 AM  Advanced Directives  Does Patient Have a Medical Advance Directive? No  Would patient like information on creating a medical advance directive? No - Patient declined   Advance Care Planning is important because it: Ensures you receive medical care that aligns with your values, goals, and preferences. Provides guidance to your family and loved ones, reducing the emotional burden of decision-making during critical moments.  Vision: Annual vision screenings are recommended for early detection of glaucoma, cataracts, and diabetic retinopathy. These exams can also reveal signs of chronic conditions such as diabetes and high blood pressure.  Dental: Annual dental screenings help detect early signs of oral cancer, gum disease, and other conditions linked to overall health, including heart disease and diabetes.  Please see the attached documents for additional preventive care recommendations.

## 2024-03-11 ENCOUNTER — Ambulatory Visit: Admitting: Family Medicine

## 2024-03-12 ENCOUNTER — Other Ambulatory Visit: Payer: Self-pay | Admitting: Family Medicine

## 2024-03-12 NOTE — Telephone Encounter (Unsigned)
 Copied from CRM [id number]. Topic: Clinical - Medication Refill >> Mar 12, 2024  9:33 AM Aisha D wrote: Medication: hydrochlorothiazide (HYDRODIURIL) 25 MG tablet, apixaban  (ELIQUIS ) 5 MG TABS tablet  Has the patient contacted their pharmacy? No (Agent: If no, request that the patient contact the pharmacy for the refill. If patient does not wish to contact the pharmacy document the reason why and proceed with request.) (Agent: If yes, when and what did the pharmacy advise?)  This is the patient's preferred pharmacy:  [id number] [place], [place] - [address] AT [facility] OF [address] [place] [address] Phone: [phone] Fax: [phone]   Is this the correct pharmacy for this prescription? Yes If no, delete pharmacy and type the correct one.   Has the prescription been filled recently? No  Is the patient out of the medication? No  Has the patient been seen for an appointment in the last year OR does the patient have an upcoming appointment? Yes  Can we respond through MyChart? Yes  Agent: Please be advised that Rx refills may take up to 3 business days. We ask that you follow-up with your pharmacy.

## 2024-03-13 MED ORDER — APIXABAN 5 MG PO TABS: 5.0000 mg | ORAL_TABLET | Freq: Two times a day (BID) | ORAL | 3 refills | Status: AC

## 2024-03-13 MED ORDER — HYDROCHLOROTHIAZIDE 25 MG PO TABS: 25.0000 mg | ORAL_TABLET | Freq: Every day | ORAL | 3 refills | Status: DC

## 2024-03-14 ENCOUNTER — Ambulatory Visit: Payer: Self-pay | Admitting: Family Medicine

## 2024-03-18 ENCOUNTER — Ambulatory Visit
Admission: RE | Admit: 2024-03-18 | Discharge: 2024-03-18 | Disposition: A | Discharge status: Home / Self Care | Source: Ambulatory Visit | Attending: Family Medicine | Admitting: Family Medicine

## 2024-03-18 DIAGNOSIS — Z1231 Encounter for screening mammogram for malignant neoplasm of breast: Secondary | ICD-10-CM | POA: Diagnosis present

## 2024-03-19 ENCOUNTER — Inpatient Hospital Stay
Admission: RE | Admit: 2024-03-19 | Discharge: 2024-03-19 | Disposition: A | Payer: Self-pay | Source: Ambulatory Visit | Attending: Family Medicine

## 2024-03-19 ENCOUNTER — Other Ambulatory Visit: Payer: Self-pay | Admitting: *Deleted

## 2024-03-19 ENCOUNTER — Inpatient Hospital Stay
Admission: RE | Admit: 2024-03-19 | Discharge: 2024-03-19 | Disposition: A | Discharge status: Home / Self Care | Payer: Self-pay | Source: Ambulatory Visit | Attending: Family Medicine | Admitting: Family Medicine

## 2024-03-19 DIAGNOSIS — Z1231 Encounter for screening mammogram for malignant neoplasm of breast: Secondary | ICD-10-CM

## 2024-03-26 ENCOUNTER — Ambulatory Visit: Admitting: Physician Assistant

## 2024-03-26 DIAGNOSIS — M1711 Unilateral primary osteoarthritis, right knee: Secondary | ICD-10-CM

## 2024-03-26 DIAGNOSIS — M150 Primary generalized (osteo)arthritis: Secondary | ICD-10-CM

## 2024-03-26 MED ORDER — LIDOCAINE HCL 1 % IJ SOLN
4.0000 mL | INTRAMUSCULAR | Status: AC | PRN
  Administered 2024-03-26: 4 mL

## 2024-03-26 MED ORDER — METHYLPREDNISOLONE ACETATE 40 MG/ML IJ SUSP
40.0000 mg | INTRAMUSCULAR | Status: AC | PRN
  Administered 2024-03-26: 40 mg via INTRA_ARTICULAR

## 2024-03-26 NOTE — Progress Notes (Signed)
 Office Visit Note   Patient: Martha Kelly           Date of Birth: July 01, 1945           MRN: [id number] Visit Date: 03/26/2024              Requested by: Mercer Clotilda SAUNDERS, MD [address] [place],  [place] [address] PCP: Mercer Clotilda SAUNDERS, MD  Chief Complaint  Patient presents with  . Right Knee - Pain      HPI: Patient is a pleasant 78 year old lady with a history of arthritis in her right knee.  She periodically gets injections in her right knee.  No new injury she has been working out a little bit more and thinks this may have aggravated it no fever or chills  Assessment & Plan: Visit Diagnoses:  1. Primary osteoarthritis involving multiple joints     Plan: Will go forward with a steroid injection today may follow-up as needed showed them some close chain exercises for quadricep strengthening  Follow-Up Instructions: Return if symptoms worsen or fail to improve.   Ortho Exam  Patient is alert, oriented, no adenopathy, well-dressed, normal affect, normal respiratory effort. Right knee no effusion no erythema compartments are soft and compressible she is neurovascularly intact.  Good varus valgus anterior stability.  Negative Toula' sign    Imaging: No results found. No images are attached to the encounter.  Labs: Lab Results  Component Value Date   HGBA1C 6.7 (H) 03/07/2024   HGBA1C 6.4 (H) 03/14/2019     Lab Results  Component Value Date   ALBUMIN 3.7 03/03/2024   ALBUMIN 3.6 03/14/2019   ALBUMIN 3.6 03/13/2019    No results found for: MG Lab Results  Component Value Date   VD25OH 70.93 03/07/2024    No results found for: PREALBUMIN    Latest Ref Rng & Units 03/03/2024    1:46 PM 03/14/2019    7:49 AM 03/13/2019    7:16 PM  CBC EXTENDED  WBC 4.0 - 10.5 K/uL 6.6  7.4    RBC 3.87 - 5.11 MIL/uL 4.68  4.86    Hemoglobin 12.0 - 15.0 g/dL 85.9  85.7  84.6   HCT 36.0 - 46.0 % 43.0  43.4  45.0   Platelets 150 - 400 K/uL 278  258        There is no height or weight on file to calculate BMI.  Orders:  No orders of the defined types were placed in this encounter.  No orders of the defined types were placed in this encounter.    Procedures: Large Joint Inj: R knee on 03/26/2024 2:08 PM Indications: pain and diagnostic evaluation Details: 25 G 1.5 in needle, anteromedial approach  Arthrogram: No  Medications: 40 mg methylPREDNISolone  acetate 40 MG/ML; 4 mL lidocaine  1 % Outcome: tolerated well, no immediate complications Procedure, treatment alternatives, risks and benefits explained, specific risks discussed. Consent was given by the patient.     Clinical Data: No additional findings.  ROS:  All other systems negative, except as noted in the HPI. Review of Systems  Objective: Vital Signs: There were no vitals taken for this visit.  Specialty Comments:  No specialty comments available.  PMFS History: Patient Active Problem List   Diagnosis Date Noted  . Statin intolerance 02/02/2024  . Implantable loop recorder present 02/02/2024  . History of stroke 02/02/2024  . Memory change 02/02/2024  . Primary osteoarthritis involving multiple joints 02/02/2024  . Paroxysmal atrial fibrillation (  HCC) 08/25/2020  . Secondary hypercoagulable state 08/25/2020  . PFO (patent foramen ovale) 03/15/2019  . Essential hypertension 03/14/2019  . HLD (hyperlipidemia) 03/14/2019  . Cryptogenic stroke (HCC) 03/14/2019   Past Medical History:  Diagnosis Date  . Hypertension   . Prediabetes     Family History  Problem Relation Age of Onset  . CAD Mother   . CAD Father   . Breast cancer Sister        x2 sisters  . Diabetes Mellitus II Maternal Uncle     Past Surgical History:  Procedure Laterality Date  . ABDOMINAL HYSTERECTOMY    . BUBBLE STUDY  03/15/2019   Procedure: BUBBLE STUDY;  Surgeon: Pietro Redell RAMAN, MD;  Location: [facility] ENDOSCOPY;  Service: Cardiovascular;;  . LOOP RECORDER INSERTION N/A  03/15/2019   Procedure: LOOP RECORDER INSERTION;  Surgeon: Inocencio Soyla Lunger, MD;  Location: [facility] INVASIVE CV LAB;  Service: Cardiovascular;  Laterality: N/A;  . NECK SURGERY    . TEE WITHOUT CARDIOVERSION N/A 03/15/2019   Procedure: TRANSESOPHAGEAL ECHOCARDIOGRAM (TEE);  Surgeon: Pietro Redell RAMAN, MD;  Location: [facility] ENDOSCOPY;  Service: Cardiovascular;  Laterality: N/A;   Social History   Occupational History  . Not on file  Tobacco Use  . Smoking status: Never  . Smokeless tobacco: Never  Substance and Sexual Activity  . Alcohol use: Never  . Drug use: Never  . Sexual activity: Not on file

## 2024-03-27 ENCOUNTER — Ambulatory Visit: Admitting: Orthopedic Surgery

## 2024-04-15 ENCOUNTER — Encounter: Payer: Self-pay | Admitting: Radiology

## 2024-04-22 ENCOUNTER — Ambulatory Visit: Admitting: Family Medicine

## 2024-04-22 ENCOUNTER — Encounter: Payer: Self-pay | Admitting: Family Medicine

## 2024-04-22 VITALS — BP 130/80 | HR 59 | Temp 98.0°F | Ht 65.0 in | Wt 229.0 lb

## 2024-04-22 DIAGNOSIS — I480 Paroxysmal atrial fibrillation: Secondary | ICD-10-CM

## 2024-04-22 DIAGNOSIS — I10 Essential (primary) hypertension: Secondary | ICD-10-CM

## 2024-04-22 DIAGNOSIS — R7989 Other specified abnormal findings of blood chemistry: Secondary | ICD-10-CM | POA: Diagnosis not present

## 2024-04-22 DIAGNOSIS — Z789 Other specified health status: Secondary | ICD-10-CM

## 2024-04-22 DIAGNOSIS — E1149 Type 2 diabetes mellitus with other diabetic neurological complication: Secondary | ICD-10-CM | POA: Diagnosis not present

## 2024-04-22 LAB — BASIC METABOLIC PANEL WITH GFR
BUN: 31 mg/dL — ABNORMAL HIGH (ref 6–23)
CO2: 30 meq/L (ref 19–32)
Calcium: 9 mg/dL (ref 8.4–10.5)
Chloride: 104 meq/L (ref 96–112)
Creatinine, Ser: 1.16 mg/dL (ref 0.40–1.20)
GFR: 45.93 mL/min — ABNORMAL LOW (ref >60.00)
Glucose, Bld: 99 mg/dL (ref 70–99)
Potassium: 4.1 meq/L (ref 3.5–5.1)
Sodium: 141 meq/L (ref 135–145)

## 2024-04-22 LAB — MICROALBUMIN / CREATININE URINE RATIO
Creatinine,U: 94.9 mg/dL
Microalb Creat Ratio: UNDETERMINED mg/g (ref 0.0–30.0)
Microalb, Ur: <0.7 mg/dL

## 2024-04-22 MED ORDER — HYDROCHLOROTHIAZIDE 25 MG PO TABS: 12.5000 mg | ORAL_TABLET | Freq: Every day | ORAL | 3 refills | Status: AC

## 2024-04-22 NOTE — Progress Notes (Signed)
 Established Patient Office Visit   Subjective  Patient ID: Martha Kelly, female    DOB: Feb 29, 1946  Age: 78 y.o. MRN: [id number]  Chief Complaint  Patient presents with   Follow-up    Patient states F/U is from dizzyness she guesses. Wondering about labs since her blood sugar has been running higher. Patient states she is only taking half a pill of hydrochlorothiazide and not a full one per her old Dr. Banker.   Patient unaccompanied this visit.  Pt is a 78 yo female with pmh sig for HTN, DM II, HLD with statin intolerance, afib, h/o cryptogenic stroke who was seen for f/u.  Pt states bs has been good.  Was 99 this am.  Typically around 89-90s.  Had 2 readings of 140, but notes it was after a steroid knee injection and after going out for dinner and having cheesecake.  Had the knee injection after experiencing increased knee pain after excessive walking.  BP has been good at home, 120-130/70-80.  Taking half a tab of hydrochlorothiazide 25 mg and lisinopril  40 mg.   No issues with afib, on eliquis .    Patient Active Problem List   Diagnosis Date Noted   Statin intolerance 02/02/2024   Implantable loop recorder present 02/02/2024   History of stroke 02/02/2024   Memory change 02/02/2024   Primary osteoarthritis involving multiple joints 02/02/2024   Paroxysmal atrial fibrillation (HCC) 08/25/2020   Secondary hypercoagulable state 08/25/2020   PFO (patent foramen ovale) 03/15/2019   Essential hypertension 03/14/2019   HLD (hyperlipidemia) 03/14/2019   Cryptogenic stroke (HCC) 03/14/2019   Past Medical History:  Diagnosis Date   Hypertension    Prediabetes    Past Surgical History:  Procedure Laterality Date   ABDOMINAL HYSTERECTOMY     BUBBLE STUDY  03/15/2019   Procedure: BUBBLE STUDY;  Surgeon: Pietro Redell RAMAN, MD;  Location: [facility] ENDOSCOPY;  Service: Cardiovascular;;   LOOP RECORDER INSERTION N/A 03/15/2019   Procedure: LOOP RECORDER INSERTION;  Surgeon: Inocencio Soyla Lunger, MD;  Location: [facility] INVASIVE CV LAB;  Service: Cardiovascular;  Laterality: N/A;   NECK SURGERY     TEE WITHOUT CARDIOVERSION N/A 03/15/2019   Procedure: TRANSESOPHAGEAL ECHOCARDIOGRAM (TEE);  Surgeon: Pietro Redell RAMAN, MD;  Location: [facility] ENDOSCOPY;  Service: Cardiovascular;  Laterality: N/A;   Social History   Tobacco Use   Smoking status: Never   Smokeless tobacco: Never  Substance Use Topics   Alcohol use: Never   Drug use: Never   Family History  Problem Relation Age of Onset   CAD Mother    CAD Father    Breast cancer Sister        x2 sisters   Diabetes Mellitus II Maternal Uncle    Allergies  Allergen Reactions   Statins Other (See Comments)    arthralgias    ROS Negative unless stated above    Objective:     BP 130/80   Pulse (!) 59   Temp 98 F (36.7 C) (Oral)   Ht 5' 5 (1.651 m)   Wt 229 lb (103.9 kg)   SpO2 95%   BMI 38.11 kg/m  BP Readings from Last 3 Encounters:  04/22/24 130/80  03/07/24 118/80  03/03/24 (!) 141/70   Wt Readings from Last 3 Encounters:  04/22/24 229 lb (103.9 kg)  03/08/24 229 lb (103.9 kg)  03/07/24 229 lb 12.8 oz (104.2 kg)      Physical Exam Constitutional:      General: She is not  in acute distress.    Appearance: Normal appearance.  HENT:     Head: Normocephalic and atraumatic.     Nose: Nose normal.     Mouth/Throat:     Mouth: Mucous membranes are moist.  Cardiovascular:     Rate and Rhythm: Normal rate and regular rhythm.     Heart sounds: Normal heart sounds. No murmur heard.    No gallop.  Pulmonary:     Effort: Pulmonary effort is normal. No respiratory distress.     Breath sounds: Normal breath sounds. No wheezing, rhonchi or rales.  Skin:    General: Skin is warm and dry.  Neurological:     Mental Status: She is alert and oriented to person, place, and time.    Diabetic Foot Exam - Simple   Simple Foot Form Diabetic Foot exam was performed with the following findings: Yes 04/22/2024   9:09 AM  Visual Inspection See comments: Yes Sensation Testing See comments: Yes Pulse Check Posterior Tibialis and Dorsalis pulse intact bilaterally: Yes See comments: Yes Comments Erythema of lateral R  and L 5 th digits.  Decreased sensation to monofilament and vibratory sense b/l.  Feet cool to touch.  Decreased DT and PT pulses b/l.        03/08/2024   11:25 AM 03/07/2024   10:08 AM 02/02/2024   10:24 AM  Depression screen PHQ 2/9  Decreased Interest 0  0  Down, Depressed, Hopeless 0 0 0  PHQ - 2 Score 0 0 0  Altered sleeping 0 0 0  Tired, decreased energy 0 1 0  Change in appetite 0 0 0  Feeling bad or failure about yourself  0 0 0  Trouble concentrating 0 0 0  Moving slowly or fidgety/restless 0 0 0  Suicidal thoughts 0 0 0  PHQ-9 Score 0  1  0   Difficult doing work/chores Not difficult at all Not difficult at all      Data saved with a previous flowsheet row definition      03/07/2024   10:08 AM 02/02/2024   10:24 AM  GAD 7 : Generalized Anxiety Score  Nervous, Anxious, on Edge 1 0  Control/stop worrying 1 0  Worry too much - different things 1 0  Trouble relaxing 0 0  Restless 0 0  Easily annoyed or irritable 0 0  Afraid - awful might happen 0 0  Total GAD 7 Score 3 0     No results found for any visits on 04/22/24.    Assessment & Plan:   Type 2 diabetes mellitus with neurological complications (HCC) -     For Home Use Only DME Diabetic Shoe -     Ambulatory referral to Podiatry -     Microalbumin / creatinine urine ratio -     Basic metabolic panel with GFR; Future  Essential hypertension -     Basic metabolic panel with GFR; Future -     hydroCHLOROthiazide; Take 0.5 tablets (12.5 mg total) by mouth daily.  Dispense: 45 tablet; Refill: 3  Paroxysmal atrial fibrillation (HCC)  Elevated serum creatinine -     Basic metabolic panel with GFR; Future  Statin intolerance  DM II diet controlled.  A1C 6.7% on 03/07/24.  Pre callus lesions on foot  exam.   Rx for diabetic shoes written.  Referral to Podiatry.  Advised to check feet.  Continue on trend blood sugar in mornings.  Continue lisinopril  40 mg daily and Repatha  for cholesterol.  History of statin intolerance.  BP controlled.  Continue lisinopril  40 mg daily and HCTZ 12.5 mg daily.  Continue Eliquis  for A-fib.  Currently controlled.  Recheck BMP as creatinine elevated during last visit.  Return in about 6 months (around 10/20/2024).   Clotilda JONELLE Single, MD

## 2024-04-22 NOTE — Patient Instructions (Signed)
 A referral to the podiatrist was placed for you to get diabetic shoes and for toenail trimming.  They will call you about setting this up.  If you have not heard anything back in the next 1-2 weeks notify clinic so we can check on this for you.  A prescription for diabetic shoes was provided.  We will recheck your kidney function this visit.

## 2024-04-23 ENCOUNTER — Telehealth: Payer: Self-pay

## 2024-04-23 ENCOUNTER — Ambulatory Visit: Payer: Self-pay | Admitting: Family Medicine

## 2024-04-23 NOTE — Telephone Encounter (Unsigned)
 Copied from CRM [id number]. Topic: General - Other >> Apr 23, 2024  9:22 AM Jasmin G wrote: Reason for CRM: Pt called regarding recent missed call, I could not find info to relay. Call pt back at [phone].

## 2024-04-23 NOTE — Telephone Encounter (Signed)
-----   Message from Margarett M sent at 04/23/2024  8:44 AM EST ----- Please abstract and route to provider.

## 2024-04-23 NOTE — Telephone Encounter (Signed)
 Left a VM for patient, Diabetic shoe order has been placed, [facility] and [facility] are not taking Diabetic shoe orders at this time.

## 2024-05-03 LAB — OPHTHALMOLOGY REPORT-SCANNED

## 2025-03-17 ENCOUNTER — Ambulatory Visit
